# Patient Record
Sex: Male | Born: 1950 | Race: White | Hispanic: No | Marital: Married | Smoking: Never smoker
Health system: Southern US, Community
[De-identification: ages and names within clinical notes are randomized; demographics above are authoritative.]

## PROBLEM LIST (undated history)

## (undated) DIAGNOSIS — Z85118 Personal history of other malignant neoplasm of bronchus and lung: Secondary | ICD-10-CM

## (undated) DIAGNOSIS — K219 Gastro-esophageal reflux disease without esophagitis: Secondary | ICD-10-CM

## (undated) DIAGNOSIS — K5909 Other constipation: Secondary | ICD-10-CM

## (undated) DIAGNOSIS — R058 Other specified cough: Secondary | ICD-10-CM

## (undated) DIAGNOSIS — E039 Hypothyroidism, unspecified: Secondary | ICD-10-CM

## (undated) DIAGNOSIS — R0989 Other specified symptoms and signs involving the circulatory and respiratory systems: Secondary | ICD-10-CM

## (undated) DIAGNOSIS — Z973 Presence of spectacles and contact lenses: Secondary | ICD-10-CM

## (undated) DIAGNOSIS — M1990 Unspecified osteoarthritis, unspecified site: Secondary | ICD-10-CM

## (undated) DIAGNOSIS — R05 Cough: Secondary | ICD-10-CM

## (undated) DIAGNOSIS — N201 Calculus of ureter: Secondary | ICD-10-CM

## (undated) DIAGNOSIS — I10 Essential (primary) hypertension: Secondary | ICD-10-CM

## (undated) HISTORY — PX: VIDEO ASSISTED THORACOSCOPY (VATS)/WEDGE RESECTION: SHX6174

---

## 2002-08-15 ENCOUNTER — Encounter: Admission: RE | Admit: 2002-08-15 | Discharge: 2002-08-15 | Payer: Self-pay | Admitting: Internal Medicine

## 2002-08-15 ENCOUNTER — Encounter: Payer: Self-pay | Admitting: Internal Medicine

## 2003-05-05 ENCOUNTER — Ambulatory Visit (HOSPITAL_COMMUNITY): Admission: RE | Admit: 2003-05-05 | Discharge: 2003-05-05 | Payer: Self-pay | Admitting: Gastroenterology

## 2003-05-08 ENCOUNTER — Emergency Department (HOSPITAL_COMMUNITY): Admission: AD | Admit: 2003-05-08 | Discharge: 2003-05-08 | Payer: Self-pay | Admitting: Family Medicine

## 2003-06-12 ENCOUNTER — Emergency Department (HOSPITAL_COMMUNITY): Admission: AD | Admit: 2003-06-12 | Discharge: 2003-06-12 | Payer: Self-pay | Admitting: Family Medicine

## 2004-02-02 ENCOUNTER — Ambulatory Visit (HOSPITAL_COMMUNITY): Admission: RE | Admit: 2004-02-02 | Discharge: 2004-02-02 | Payer: Self-pay | Admitting: Thoracic Surgery

## 2004-11-08 ENCOUNTER — Emergency Department (HOSPITAL_COMMUNITY): Admission: EM | Admit: 2004-11-08 | Discharge: 2004-11-08 | Payer: Self-pay | Admitting: Family Medicine

## 2005-09-20 ENCOUNTER — Encounter (INDEPENDENT_AMBULATORY_CARE_PROVIDER_SITE_OTHER): Payer: Self-pay | Admitting: Specialist

## 2005-09-20 ENCOUNTER — Ambulatory Visit (HOSPITAL_COMMUNITY): Admission: RE | Admit: 2005-09-20 | Discharge: 2005-09-20 | Payer: Self-pay | Admitting: Thoracic Surgery

## 2005-09-26 ENCOUNTER — Encounter (INDEPENDENT_AMBULATORY_CARE_PROVIDER_SITE_OTHER): Payer: Self-pay | Admitting: *Deleted

## 2005-09-26 ENCOUNTER — Inpatient Hospital Stay (HOSPITAL_COMMUNITY): Admission: RE | Admit: 2005-09-26 | Discharge: 2005-10-01 | Payer: Self-pay | Admitting: Thoracic Surgery

## 2005-09-26 HISTORY — PX: THORACOTOMY/LOBECTOMY: SHX6116

## 2005-09-30 ENCOUNTER — Ambulatory Visit: Payer: Self-pay | Admitting: Oncology

## 2005-10-05 ENCOUNTER — Encounter: Admission: RE | Admit: 2005-10-05 | Discharge: 2005-10-05 | Payer: Self-pay | Admitting: Thoracic Surgery

## 2005-10-19 ENCOUNTER — Encounter: Admission: RE | Admit: 2005-10-19 | Discharge: 2005-10-19 | Payer: Self-pay | Admitting: Thoracic Surgery

## 2005-11-09 ENCOUNTER — Encounter: Admission: RE | Admit: 2005-11-09 | Discharge: 2005-11-09 | Payer: Self-pay | Admitting: Thoracic Surgery

## 2005-11-19 ENCOUNTER — Emergency Department (HOSPITAL_COMMUNITY): Admission: EM | Admit: 2005-11-19 | Discharge: 2005-11-19 | Payer: Self-pay | Admitting: Family Medicine

## 2005-12-08 ENCOUNTER — Ambulatory Visit: Payer: Self-pay | Admitting: Oncology

## 2005-12-13 LAB — CBC WITH DIFFERENTIAL/PLATELET
Basophils Absolute: 0 10*3/uL (ref 0.0–0.1)
EOS%: 2.9 % (ref 0.0–7.0)
Eosinophils Absolute: 0.2 10*3/uL (ref 0.0–0.5)
HCT: 40.3 % (ref 38.7–49.9)
HGB: 13.9 g/dL (ref 13.0–17.1)
LYMPH%: 25 % (ref 14.0–48.0)
MCH: 30.6 pg (ref 28.0–33.4)
MCV: 88.5 fL (ref 81.6–98.0)
MONO%: 8.8 % (ref 0.0–13.0)
NEUT#: 4.5 10*3/uL (ref 1.5–6.5)
NEUT%: 62.7 % (ref 40.0–75.0)
Platelets: 257 10*3/uL (ref 145–400)

## 2005-12-13 LAB — COMPREHENSIVE METABOLIC PANEL
AST: 21 U/L (ref 0–37)
Albumin: 4.5 g/dL (ref 3.5–5.2)
Alkaline Phosphatase: 60 U/L (ref 39–117)
BUN: 17 mg/dL (ref 6–23)
Creatinine, Ser: 1.21 mg/dL (ref 0.40–1.50)
Glucose, Bld: 124 mg/dL — ABNORMAL HIGH (ref 70–99)
Potassium: 4.2 mEq/L (ref 3.5–5.3)

## 2005-12-15 ENCOUNTER — Encounter: Admission: RE | Admit: 2005-12-15 | Discharge: 2005-12-15 | Payer: Self-pay | Admitting: Oncology

## 2005-12-21 ENCOUNTER — Encounter: Admission: RE | Admit: 2005-12-21 | Discharge: 2005-12-21 | Payer: Self-pay | Admitting: Thoracic Surgery

## 2006-04-06 ENCOUNTER — Ambulatory Visit: Payer: Self-pay | Admitting: Oncology

## 2006-04-12 ENCOUNTER — Ambulatory Visit: Payer: Self-pay | Admitting: Oncology

## 2006-04-12 LAB — COMPREHENSIVE METABOLIC PANEL
Albumin: 4.6 g/dL (ref 3.5–5.2)
BUN: 19 mg/dL (ref 6–23)
CO2: 28 mEq/L (ref 19–32)
Calcium: 9.4 mg/dL (ref 8.4–10.5)
Glucose, Bld: 110 mg/dL — ABNORMAL HIGH (ref 70–99)
Potassium: 4.4 mEq/L (ref 3.5–5.3)
Sodium: 141 mEq/L (ref 135–145)
Total Protein: 6.8 g/dL (ref 6.0–8.3)

## 2006-04-12 LAB — CBC WITH DIFFERENTIAL/PLATELET
Basophils Absolute: 0 10*3/uL (ref 0.0–0.1)
Eosinophils Absolute: 0.2 10*3/uL (ref 0.0–0.5)
HGB: 14 g/dL (ref 13.0–17.1)
MCV: 90.8 fL (ref 81.6–98.0)
MONO#: 0.5 10*3/uL (ref 0.1–0.9)
NEUT#: 5.6 10*3/uL (ref 1.5–6.5)
Platelets: 243 10*3/uL (ref 145–400)
RBC: 4.56 10*6/uL (ref 4.20–5.71)
RDW: 13.1 % (ref 11.2–14.6)
WBC: 7.8 10*3/uL (ref 4.0–10.0)

## 2006-04-12 LAB — LACTATE DEHYDROGENASE: LDH: 163 U/L (ref 94–250)

## 2006-04-13 ENCOUNTER — Encounter: Admission: RE | Admit: 2006-04-13 | Discharge: 2006-04-13 | Payer: Self-pay | Admitting: Oncology

## 2006-05-03 ENCOUNTER — Encounter: Admission: RE | Admit: 2006-05-03 | Discharge: 2006-05-03 | Payer: Self-pay | Admitting: Thoracic Surgery

## 2006-08-11 ENCOUNTER — Ambulatory Visit: Payer: Self-pay | Admitting: Oncology

## 2006-08-15 LAB — CBC WITH DIFFERENTIAL/PLATELET
BASO%: 0.5 % (ref 0.0–2.0)
Eosinophils Absolute: 0.3 10*3/uL (ref 0.0–0.5)
LYMPH%: 38.1 % (ref 14.0–48.0)
MCHC: 35.5 g/dL (ref 32.0–35.9)
MONO#: 0.7 10*3/uL (ref 0.1–0.9)
NEUT#: 2.8 10*3/uL (ref 1.5–6.5)
RBC: 4.41 10*6/uL (ref 4.20–5.71)
RDW: 13.1 % (ref 11.2–14.6)
WBC: 6.2 10*3/uL (ref 4.0–10.0)
lymph#: 2.4 10*3/uL (ref 0.9–3.3)

## 2006-08-15 LAB — COMPREHENSIVE METABOLIC PANEL
ALT: 28 U/L (ref 0–53)
Albumin: 3.9 g/dL (ref 3.5–5.2)
CO2: 25 mEq/L (ref 19–32)
Chloride: 105 mEq/L (ref 96–112)
Potassium: 4.3 mEq/L (ref 3.5–5.3)
Sodium: 140 mEq/L (ref 135–145)
Total Bilirubin: 1.3 mg/dL — ABNORMAL HIGH (ref 0.3–1.2)
Total Protein: 6.5 g/dL (ref 6.0–8.3)

## 2006-08-15 LAB — LACTATE DEHYDROGENASE: LDH: 187 U/L (ref 94–250)

## 2006-08-18 ENCOUNTER — Ambulatory Visit (HOSPITAL_COMMUNITY): Admission: RE | Admit: 2006-08-18 | Discharge: 2006-08-18 | Payer: Self-pay | Admitting: Oncology

## 2006-09-06 ENCOUNTER — Encounter: Admission: RE | Admit: 2006-09-06 | Discharge: 2006-09-06 | Payer: Self-pay | Admitting: Thoracic Surgery

## 2006-09-06 ENCOUNTER — Ambulatory Visit: Payer: Self-pay | Admitting: Thoracic Surgery

## 2006-12-07 ENCOUNTER — Ambulatory Visit: Payer: Self-pay | Admitting: Oncology

## 2006-12-12 LAB — COMPREHENSIVE METABOLIC PANEL
ALT: 24 U/L (ref 0–53)
AST: 20 U/L (ref 0–37)
Creatinine, Ser: 1.51 mg/dL — ABNORMAL HIGH (ref 0.40–1.50)
Total Bilirubin: 0.7 mg/dL (ref 0.3–1.2)

## 2006-12-12 LAB — CBC WITH DIFFERENTIAL/PLATELET
BASO%: 0.3 % (ref 0.0–2.0)
EOS%: 0.5 % (ref 0.0–7.0)
HCT: 39.2 % (ref 38.7–49.9)
LYMPH%: 13.1 % — ABNORMAL LOW (ref 14.0–48.0)
MCH: 31.4 pg (ref 28.0–33.4)
MCHC: 35.8 g/dL (ref 32.0–35.9)
NEUT%: 78.7 % — ABNORMAL HIGH (ref 40.0–75.0)
RBC: 4.47 10*6/uL (ref 4.20–5.71)
lymph#: 1.4 10*3/uL (ref 0.9–3.3)

## 2006-12-15 ENCOUNTER — Ambulatory Visit (HOSPITAL_COMMUNITY): Admission: RE | Admit: 2006-12-15 | Discharge: 2006-12-15 | Payer: Self-pay | Admitting: Oncology

## 2007-01-03 ENCOUNTER — Ambulatory Visit: Payer: Self-pay | Admitting: Thoracic Surgery

## 2007-01-27 ENCOUNTER — Emergency Department (HOSPITAL_COMMUNITY): Admission: EM | Admit: 2007-01-27 | Discharge: 2007-01-27 | Payer: Self-pay | Admitting: Emergency Medicine

## 2007-03-13 IMAGING — CR DG CHEST 1V PORT
1 series · 1 of 1 positions shown · non-contrast
Comparison: 09/26/05.

CLINICAL DATA: Thoracotomy.  
 PORTABLE CHEST ? 1 VIEW:

[view not recorded]
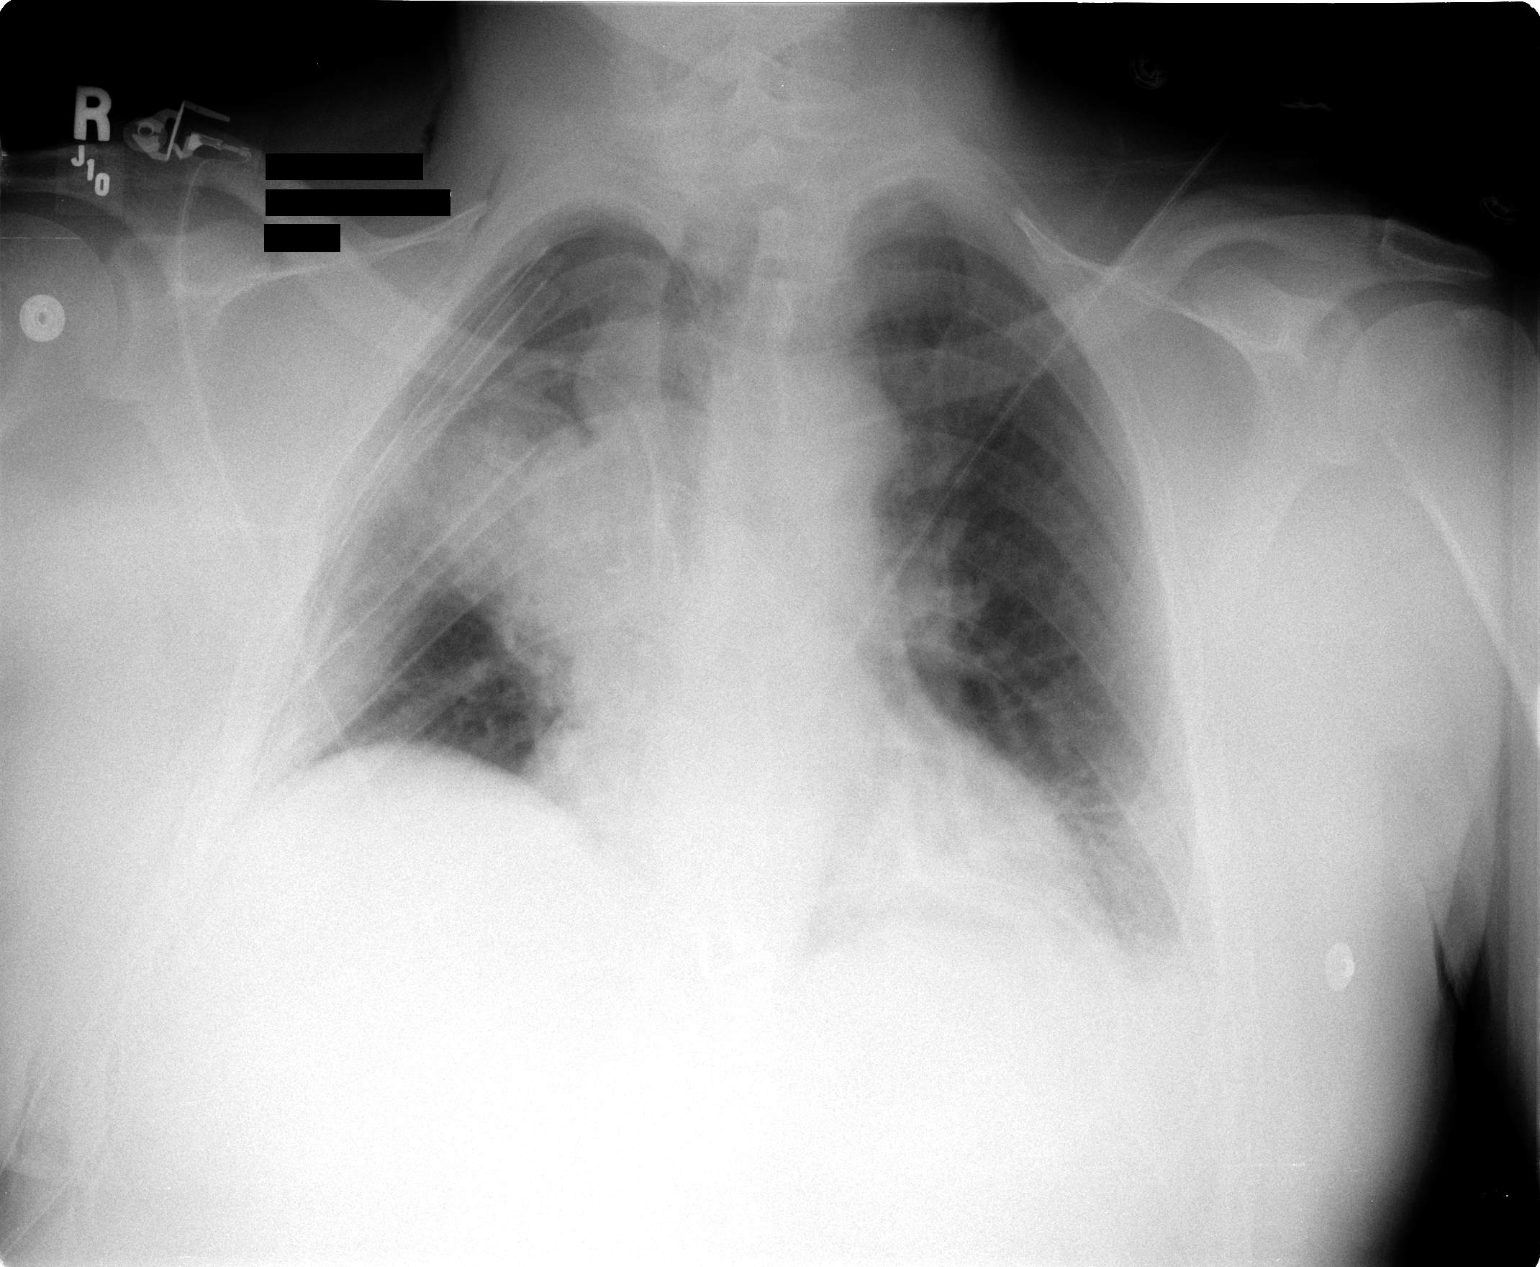

[1 of 1 positions shown; findings below may reference images not displayed]

FINDINGS: There are two right-sided chest tubes.  Postop changes in the right hemithorax are again noted.  There is persists atelectasis left base.
IMPRESSION: 1.  Two right-sided chest tubes without pneumothorax. 
 2.  Postop changes right hemithorax. 
 3.  Left base atelectasis.

## 2007-03-15 IMAGING — CR DG CHEST 1V PORT
1 series · 1 of 1 positions shown · non-contrast
Comparison: none

CLINICAL DATA: Right chest tube has been removed.  
 PORTABLE CHEST ? 1 VIEW ? 09/29/05 AT 6066 HOURS:

[view not recorded]
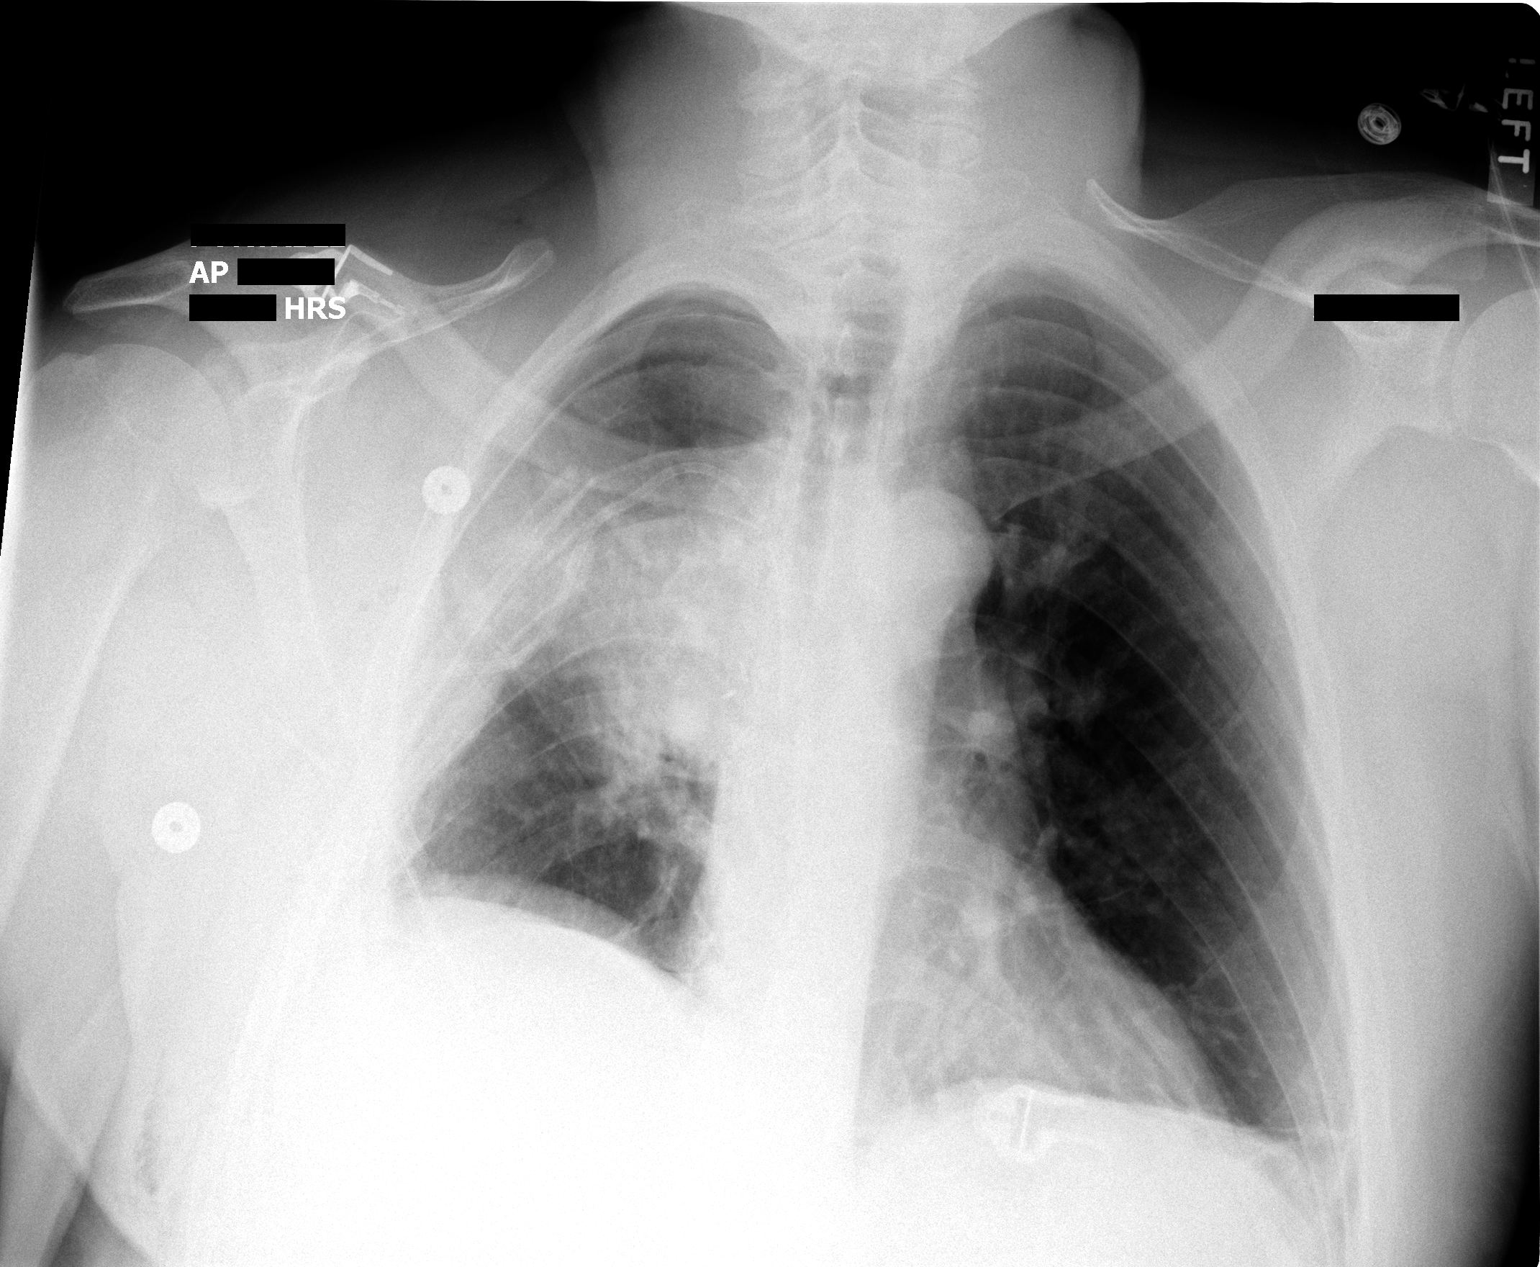

[1 of 1 positions shown; findings below may reference images not displayed]

FINDINGS: An AP erect portable film of the chest made 09/29/05 at 6066 hours is compared to the previous study of 09/29/05 at 4594 hours and now shows the right chest tube to have been removed.  There remains a right Port-A-Cath in place.  The right apical pneumothorax has significantly decreased and now is estimated at less than 3%.  There is again noted the density in the region of the right hilus in the right midlung.  There is also some atelectasis at both costophrenic angles.
IMPRESSION: Right chest tube has been removed.  There has been interval decrease in the amount of right apical pneumothorax which is now estimated at less than 3%.

## 2007-03-16 IMAGING — CR DG CHEST 2V
2 series · 2 of 2 positions shown · non-contrast
Comparison: 09/29/05.

CLINICAL DATA: Post-VATS for lung lesion.
 CHEST ? 2 VIEW:

[w chest pa]
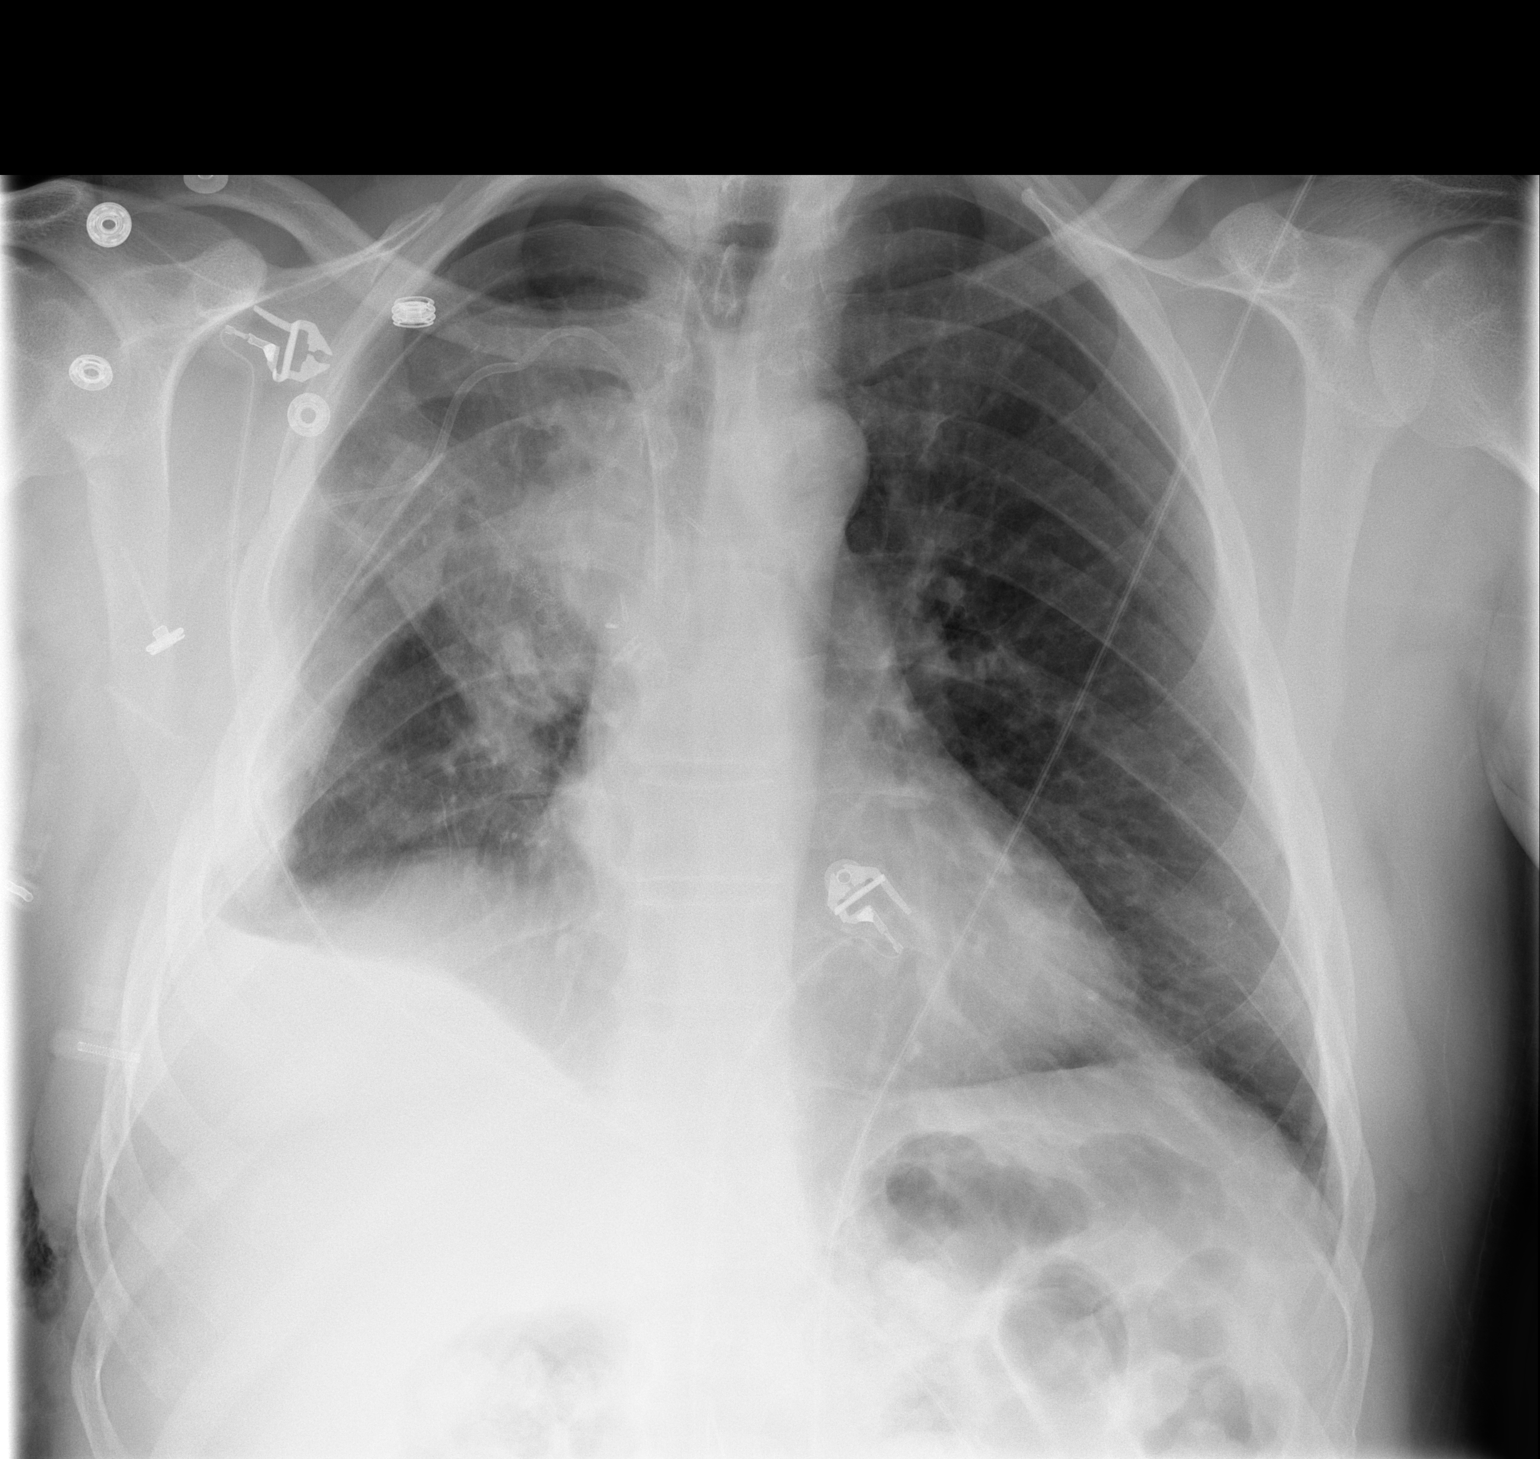

[w chest lat]
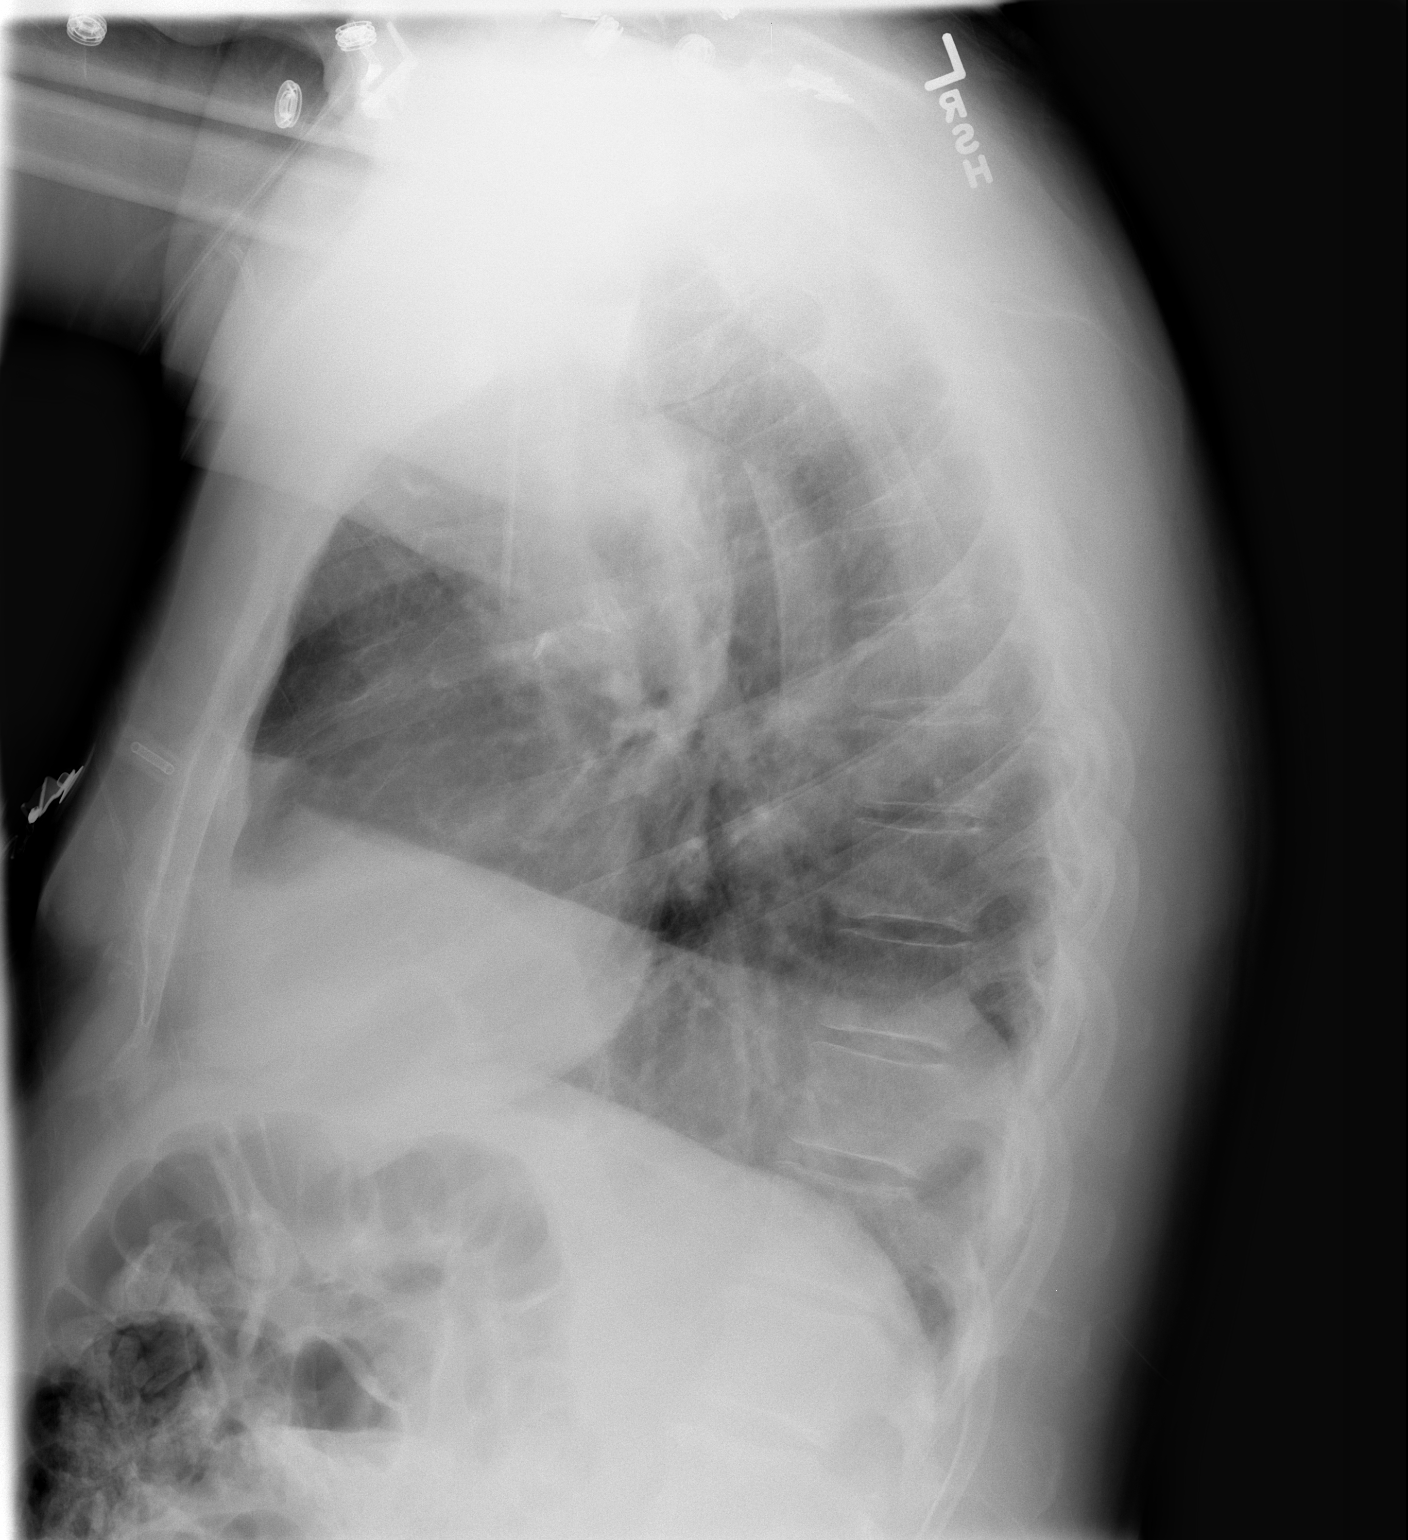

[2 of 2 positions shown; findings below may reference images not displayed]

FINDINGS: Right apical pneumothorax of about 5% to 10%.  Slightly larger.  
 No change in the right lung or pleural densities.  Left lung remains clear.  
 Port-A-Cath is ?pinched? near the costoclavicular ligament.  This needs to be watched closely as it can be a harbinger for catheter fracture.
IMPRESSION: 1.  Postoperative changes on the right.  The right apical pneumothorax is slightly larger, in the 5 to 10% size range.    
 2.  The right Port-A-Cath is ?pinched? under the clavicle.  See report.

## 2007-04-19 ENCOUNTER — Ambulatory Visit: Payer: Self-pay | Admitting: Surgery

## 2007-04-27 ENCOUNTER — Ambulatory Visit: Payer: Self-pay | Admitting: Oncology

## 2008-05-30 IMAGING — CT CT CHEST W/ CM
2 of 3 series · 15 of 36 positions shown, 18 images · non-contrast
Comparison: none

CLINICAL DATA: Lung carcinoma status post resection.

[Series 2: chest routine 5.0 b40f · axial · 0.66mm/px · z∈[+508,+793]mm · 12 of 67 slices shown, 15 images]
[im 5/67  mediastinal]
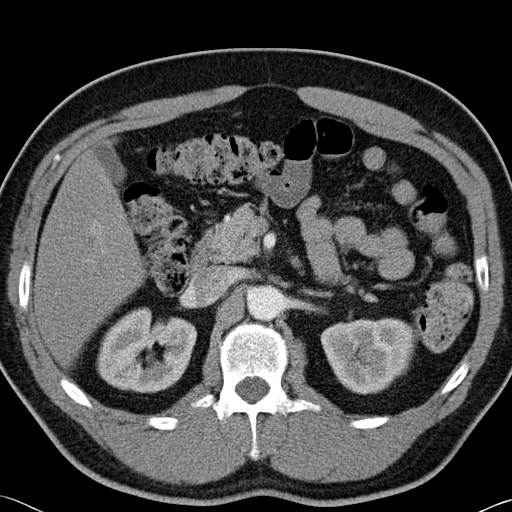
[im 5/67  lung]
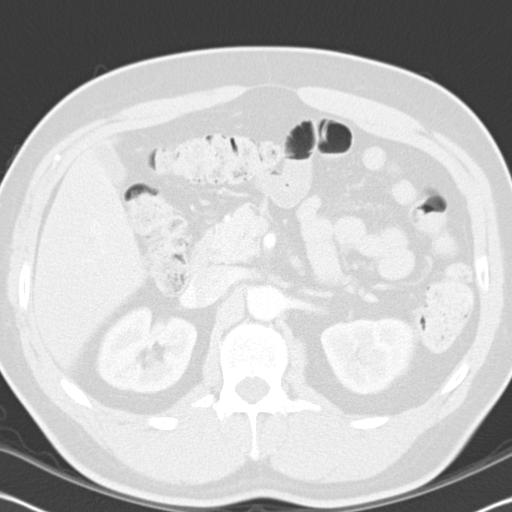
[im 10/67  lung]
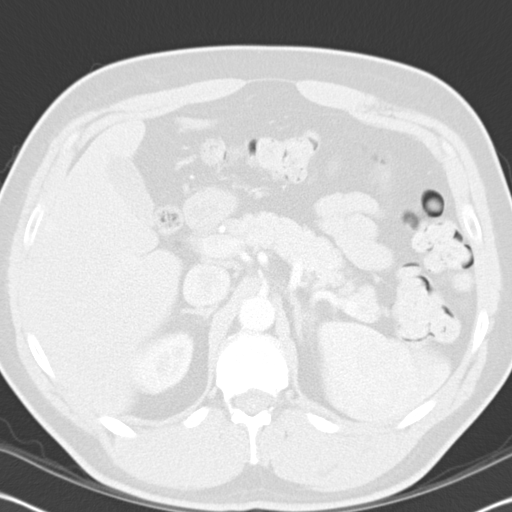
[im 15/67  lung]
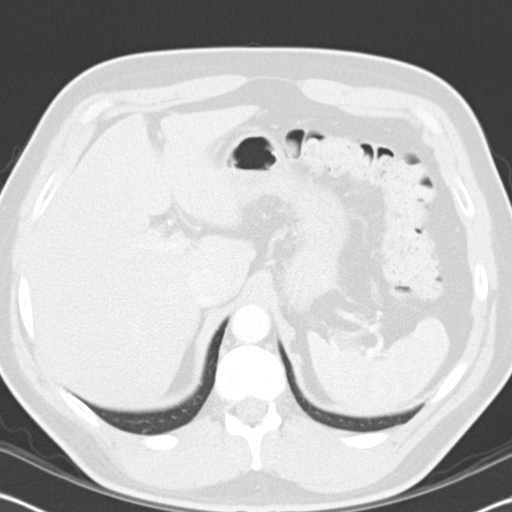
[im 20/67  lung]
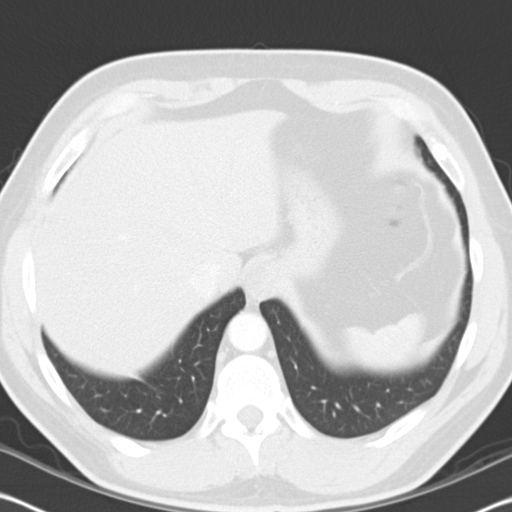
[im 25/67  mediastinal]
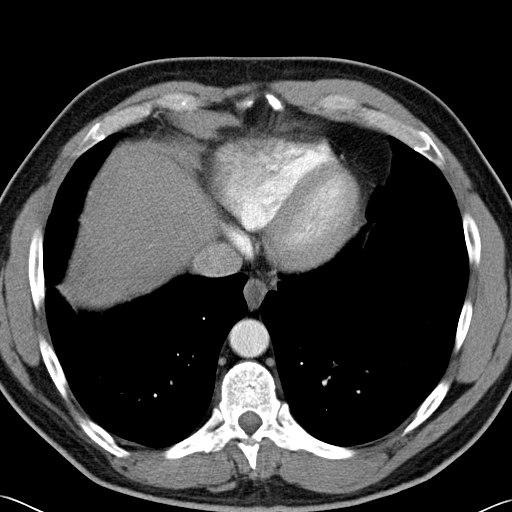
[im 25/67  lung]
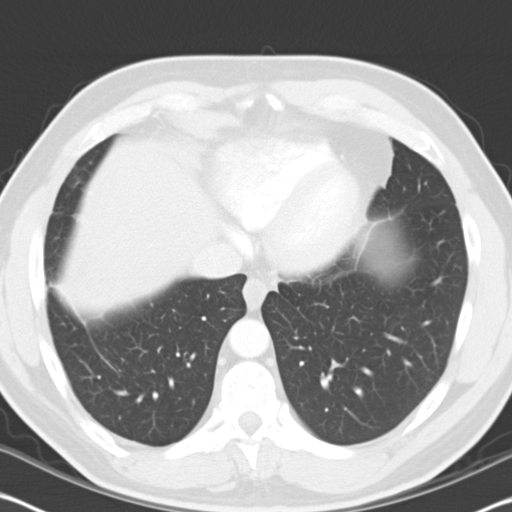
[im 30/67  lung]
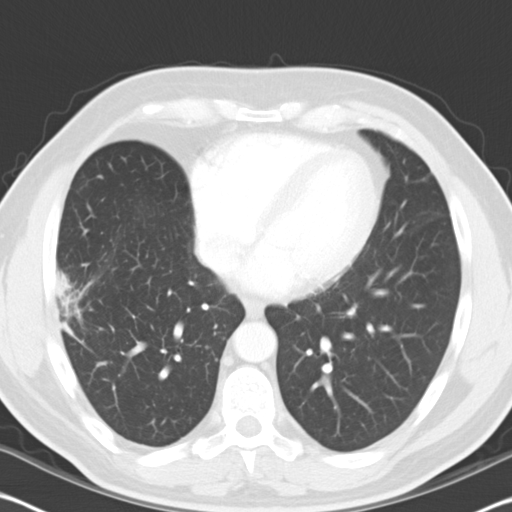
[im 37/67  lung]
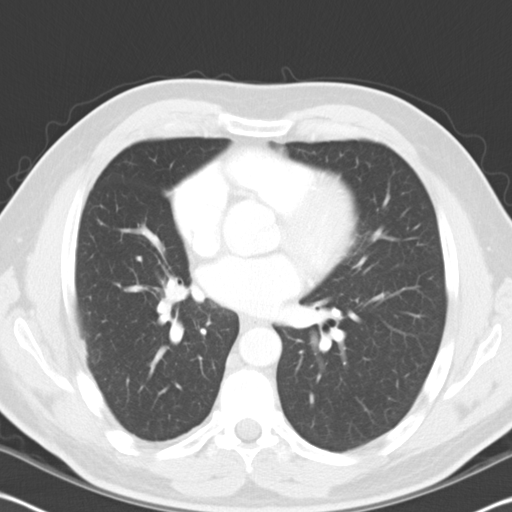
[im 42/67  lung]
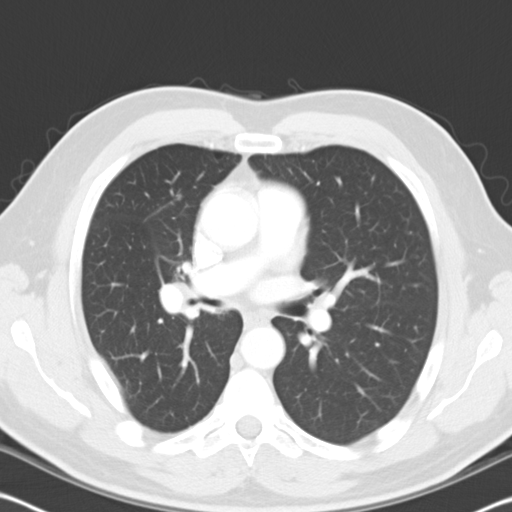
[im 47/67  mediastinal]
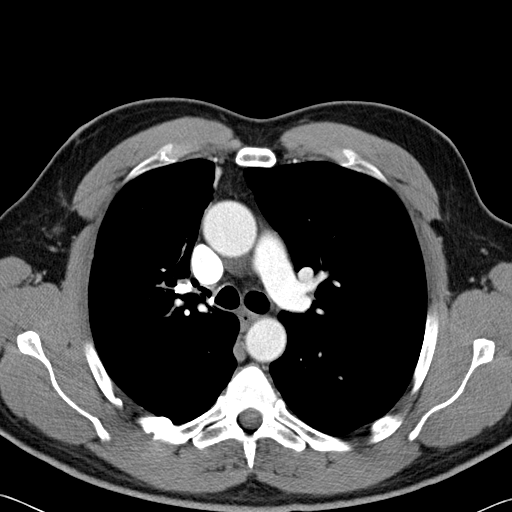
[im 47/67  lung]
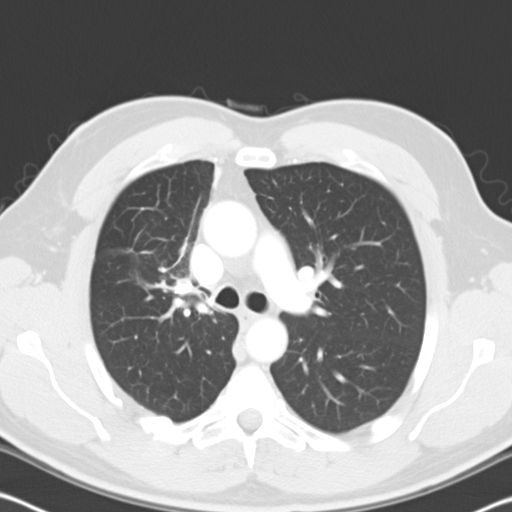
[im 52/67  lung]
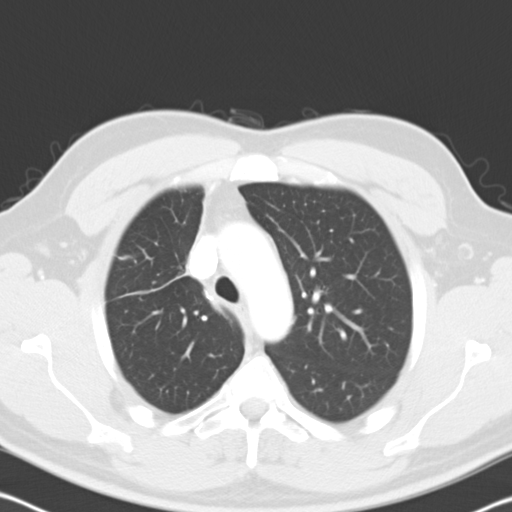
[im 57/67  lung]
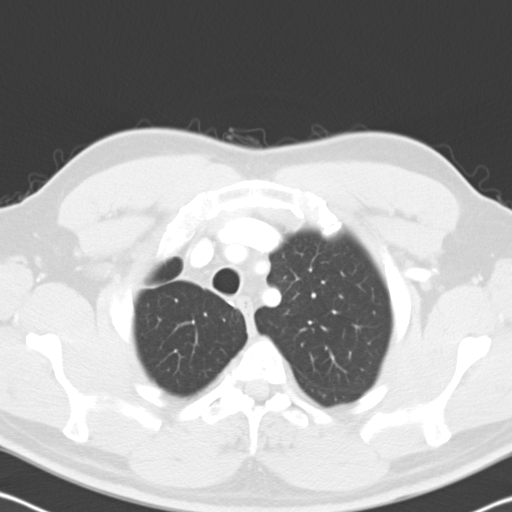
[im 62/67  lung]
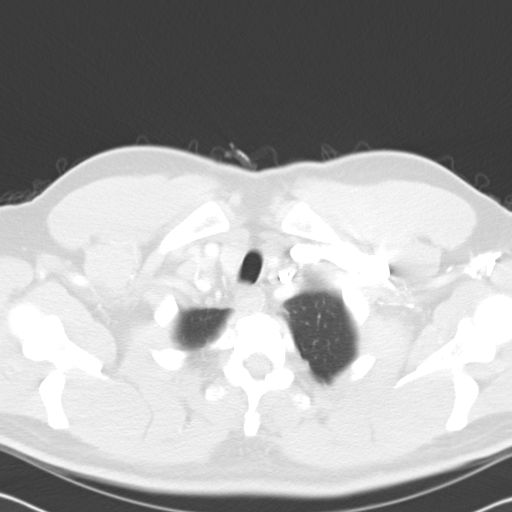

[Series 602: <mpr range> · coronal · 0.66mm/px · 3 of 120 slices shown]
[im 24/120  lung]
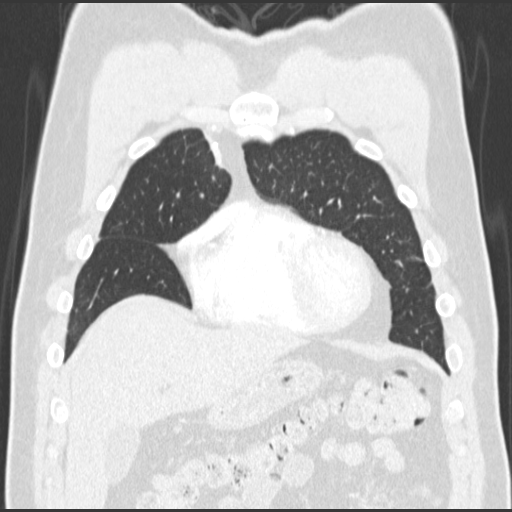
[im 48/120  lung]
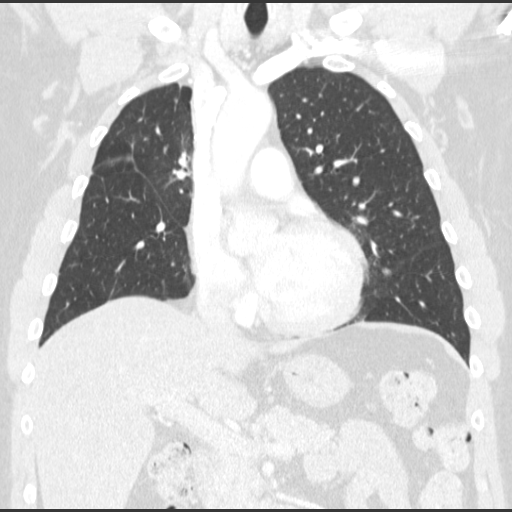
[im 72/120  lung]
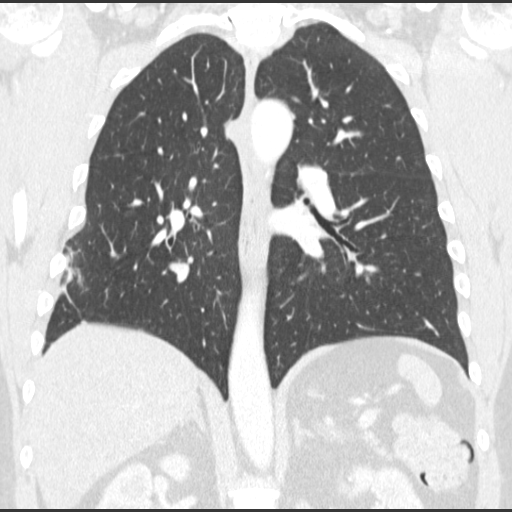

[15 of 36 positions shown; findings below may reference images not displayed]

CT chest with contrast:

Multidetector helical CT after  80 ml 8mnipaque-IPP IV.
Comparison 08/18/2006. Changes of previous right thoracotomy. Post partial right
pneumonectomy. Stable right hilar scarring without evident mass or adenopathy.
There is some pleural based nodular opacities laterally at the right lung base
which which are slightly more prominent than on the previous study. This region
measures approximately 6 x 23 mm in maximum axial dimensions. No new mediastinal
adenopathy. No pleural or pericardial effusion. Visualized upper abdomen
including adrenal glands unremarkable.
IMPRESSION: 1. Slight increase in pleural based masslike consolidation laterally at the
right lung base suggesting residual or recurrent tumor versus progressive
scarring. PET-CT may be useful to evaluate for degree of metabolic activity.
This may be approachable for FNA biopsy if needed.

## 2010-05-02 ENCOUNTER — Encounter: Payer: Self-pay | Admitting: Thoracic Surgery

## 2010-05-02 ENCOUNTER — Encounter: Payer: Self-pay | Admitting: Oncology

## 2010-05-03 ENCOUNTER — Encounter: Payer: Self-pay | Admitting: Thoracic Surgery

## 2010-07-15 ENCOUNTER — Emergency Department (HOSPITAL_COMMUNITY): Payer: BC Managed Care – PPO

## 2010-07-15 ENCOUNTER — Inpatient Hospital Stay (INDEPENDENT_AMBULATORY_CARE_PROVIDER_SITE_OTHER)
Admission: RE | Admit: 2010-07-15 | Discharge: 2010-07-15 | Disposition: A | Discharge status: Home / Self Care | Payer: BC Managed Care – PPO | Source: Ambulatory Visit | Attending: Emergency Medicine | Admitting: Emergency Medicine

## 2010-07-15 ENCOUNTER — Emergency Department (HOSPITAL_COMMUNITY)
Admission: EM | Admit: 2010-07-15 | Discharge: 2010-07-15 | Disposition: A | Discharge status: Home / Self Care | Payer: BC Managed Care – PPO | Attending: Emergency Medicine | Admitting: Emergency Medicine

## 2010-07-15 DIAGNOSIS — I10 Essential (primary) hypertension: Secondary | ICD-10-CM | POA: Insufficient documentation

## 2010-07-15 DIAGNOSIS — N200 Calculus of kidney: Secondary | ICD-10-CM | POA: Insufficient documentation

## 2010-07-15 DIAGNOSIS — Z79899 Other long term (current) drug therapy: Secondary | ICD-10-CM | POA: Insufficient documentation

## 2010-07-15 DIAGNOSIS — N1330 Unspecified hydronephrosis: Secondary | ICD-10-CM | POA: Insufficient documentation

## 2010-07-15 DIAGNOSIS — Z86718 Personal history of other venous thrombosis and embolism: Secondary | ICD-10-CM | POA: Insufficient documentation

## 2010-07-15 DIAGNOSIS — R109 Unspecified abdominal pain: Secondary | ICD-10-CM

## 2010-07-15 DIAGNOSIS — E785 Hyperlipidemia, unspecified: Secondary | ICD-10-CM | POA: Insufficient documentation

## 2010-07-15 DIAGNOSIS — Z85118 Personal history of other malignant neoplasm of bronchus and lung: Secondary | ICD-10-CM | POA: Insufficient documentation

## 2010-07-15 DIAGNOSIS — E039 Hypothyroidism, unspecified: Secondary | ICD-10-CM | POA: Insufficient documentation

## 2010-07-15 DIAGNOSIS — R52 Pain, unspecified: Secondary | ICD-10-CM | POA: Insufficient documentation

## 2010-07-15 DIAGNOSIS — R3911 Hesitancy of micturition: Secondary | ICD-10-CM | POA: Insufficient documentation

## 2010-07-15 LAB — CBC
HCT: 40.6 % (ref 39.0–52.0)
Hemoglobin: 13.6 g/dL (ref 13.0–17.0)
MCV: 82.9 fL (ref 78.0–100.0)
RBC: 4.9 MIL/uL (ref 4.22–5.81)
RDW: 16 % — ABNORMAL HIGH (ref 11.5–15.5)
WBC: 7.6 10*3/uL (ref 4.0–10.5)

## 2010-07-15 LAB — POCT I-STAT, CHEM 8
Chloride: 105 mEq/L (ref 96–112)
Glucose, Bld: 149 mg/dL — ABNORMAL HIGH (ref 70–99)
HCT: 43 % (ref 39.0–52.0)
Potassium: 4 mEq/L (ref 3.5–5.1)
Sodium: 142 mEq/L (ref 135–145)

## 2010-07-15 LAB — POCT URINALYSIS DIP (DEVICE)
Glucose, UA: NEGATIVE mg/dL
Nitrite: NEGATIVE
Specific Gravity, Urine: >=1.03 (ref 1.005–1.030)

## 2010-07-15 LAB — DIFFERENTIAL
Basophils Absolute: 0 10*3/uL (ref 0.0–0.1)
Eosinophils Relative: 1 % (ref 0–5)
Lymphocytes Relative: 11 % — ABNORMAL LOW (ref 12–46)
Lymphs Abs: 0.9 10*3/uL (ref 0.7–4.0)
Neutro Abs: 6.1 10*3/uL (ref 1.7–7.7)

## 2010-08-24 NOTE — Assessment & Plan Note (Signed)
OFFICE VISIT   MIKLE, STERNBERG  DOB:  [birth year]                                        Sep 06, 2006  CHART #:  [id number]   Mr. Schriefer came for followup today.  His blood pressure is 141/83,  pulse 90, respirations 18, sats are 97%.  Incisions were well healed.  Chest x-ray was stable.  It showed postoperative change with scarring on  the right.  Recent CT scan showed no evidence of recurrence of his  cancer.  He will have another one in September and I will see him back  at that time.   Ines Bloomer, M.D.  Electronically Signed   DPB/MEDQ  D:  09/06/2006  T:  09/06/2006  Job:  [id number]

## 2010-08-24 NOTE — Procedures (Signed)
DUPLEX DEEP VENOUS EXAM - LOWER EXTREMITY   INDICATION:  Localized right calf pain and swelling, status post long  resection in December, 2008.   HISTORY:  Edema:  Yes.  Trauma/Surgery:  Pain:  Yes.  PE:  No.  Previous DVT:  No.  Anticoagulants:  Patient takes one 325 mg aspirin nightly.  Other:   DUPLEX EXAM:                CFV   SFV   PopV  PTV    GSV                R  L  R  L  R  L  R   L  R  L  Thrombosis    o  o  o     o     o      o  Spontaneous   +  +  +     +     +      +  Phasic        +  +  +     +     +      +  Augmentation  +  +  +     +     +      +  Compressible  +  +  +     +     +      +  Competent     +  +  +     +     +      +   Legend:  + - yes  o - no  p - partial  D - decreased   Superficial venous thrombosis in the posterior tributary branch of the  greater saphenous vein at the level of the mid calf to proximal calf.   IMPRESSION:  1. No evidence of right lower extremity deep venous thrombosis.  2. Evidence of superficial venous thrombosis in the posterior      tributary branch of the greater saphenous vein at the level of the      mid calf to proximal calf.    _____________________________  V. Charlena Cross, MD   PB/MEDQ  D:  04/19/2007  T:  04/19/2007  Job:  [id number]

## 2010-08-24 NOTE — Letter (Signed)
January 03, 2007   Genene Churn. Cyndie Chime, M.D.  [address] [facility] [facility]  [place]  [place] [address]   Re:  ELJAY, LAVE                DOB:  1950-12-13   Dear Rosanne Ashing:   I saw Mr. Knight in the office today and reviewed his CT scans and I  agree with you that the CT scan looks like it is probably some scarring.  I doubt and it does not look like there is a lot of an increase where we  did his wedge resection but then you always could worry that this could  be a recurrent cancer.  I do think that taking the conservative approach  and repeating the CT scan in 4 months is the way to go since this was  such a slow growing cancer to begin with.  My thought though is this is  probably just evidence of scarring.  I discussed this with Mr. Montminy  and he is agreeable with this plan.  I will see him back in January.  His blood pressure was 145/92, pulse 69, respirations 18, saturations  were 95%.   Ines Bloomer, M.D.  Electronically Signed   DPB/MEDQ  D:  01/03/2007  T:  01/04/2007  Job:  [id number]

## 2010-08-27 NOTE — Consult Note (Signed)
NAME:  James Christian, James Christian NO.:  [id number]   MEDICAL RECORD NO.:  [id number]          PATIENT TYPE:  INP   LOCATION:  2004                         FACILITY:  [facility]   PHYSICIAN:  Genene Churn. 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   James Christian has an interesting history.  James Christian is a nonsmoker.  James Christian was found to  have an abnormal area in the right upper lobe on chest radiograph two years  ago.  The mass had irregular characteristics on CT scan.  It showed minimal  uptake on a PET scan with SUV of 2.  James Christian was followed for the first year with  CT scans at four month intervals and then subsequent six month intervals.  The lesion showed subtle changes on a most recent August 08, 2005, study done  at [facility].  There was interval enlargement and increased density of  what is described as an elongated spiculated irregularly marginated right  upper lobe lesion, now measuring 3 x 2.5 cm in transverse diameter compared  with 2 x 2.5 cm. Lenghtwise dimensions up to 8 cm compared with 6 cm. Scans  currently not available for my review at time of this dictation.   A biopsy was performed on September 20, 2005, and was consistent with  adenocarcinoma.  The patient was admitted on September 26, 2005, and underwent a  right upper lobe lobectomy.  The pathology shows a 3 cm bronchioalveolar  carcinoma.  There is focal involvement of the visceral pleura.  Lymph nodes  taken from level 12, level I, level II, level III, and an intraparenchymal  lymph node all negative.  A tertiary margin was positive for  bronchioalveolar carcinoma but per my discussion with Dr. Edwyna Shell, all final  margins were negative.   Past medical history includes  hypothyroidism, hyperlipidemia, hypertension,  seasonal allergies.  An appendectomy at age [age].  No other surgeries.  No  radiation exposure.  No radioactive iodine for James Christian thyroid disease.   MEDS ON ADMISSION:  Included Avecor 500/20, 2 at h.s., Synthroid 0.75 mg  daily, Benicar 20 mg h.s., Allegra one h.s., Nasonex b.i.d., aspirin one  h.s., Xanax 0.25 mg p.r.n., Pepcid 20 mg p.r.n., Amicar 2 daily (?).   SOCIAL HISTORY:  James Christian is married.  James Christian and James Christian accompanies him tonight.  James Christian is a nonsmoker.  Rare alcohol.   REVIEW OF SYSTEMS:  No cardiorespiratory symptoms prior to the lung cancer  diagnosis.  No constitutional symptoms.   PHYSICAL EXAMINATION:  VITAL SIGNS:  Blood pressure 150/96, pulse 66 and regular, respirations 16,  temperature 97.5.  SKIN HAIR AND NAILS:  Normal.  HEENT:  Pupils equal and reactive to light.  Pharynx no erythema, exudate or  mass.  NECK:  No thyromegaly.  No lymphadenopathy in the neck, supraclavicular or  axillary regions.  CHEST:  Healing scar right hemithorax, left lung is clear, upper right lung  clear.  HEART:  Regular cardiac rhythm.  No murmur or gallop.  ABDOMEN:  Soft and nontender.  EXTREMITIES:  No edema.  No calf tenderness.   PERTINENT LAB:  On June 15, preop, hemoglobin 15, hematocrit 45, MCV 91,  white count 6700, platelet count 239,000.  Chem profile normal.  Random  glucose 118. BUN 14, creatinine 1.3, calcium 9.6, albumin 4.4, SGOT 27, SGPT  31, alkaline phosphatase 54, bilirubin 1.1.  Urinalysis normal.  Blood type  O+.  Electrocardiogram normal.   IMPRESSION:  60 year old nonsmoker being followed since October2005 for an  atypical right upper lobe lung lesion now found to be bronchioalveolar  carcinoma.  James Christian has a stage IB by virtue of focal visceral pleural  involvement.  Overall tumor size is small at 3 cm.  All lymph nodes are  negative.  No evidence for metastatic spread (T3N0M0).   RECOMMENDATION.:  Unfortunately at the  present time as data has become more  mature from a number of large recent clinical trials, there does not seem to  be a significant benefit to adjuvant chemotherapy in stage IA or IB non-  small cell lung cancers.   RECOMMENDATIONS:  Observation alone with clinical and radiographic follow-up  at intervals.  If James Christian does show signs of progression I think James Christian would be a  good candidate for oral Tarceva, epidermal growth factor receptor inhibitor,  which is showing higher responses in previous nonsmokers with  bronchioalveolar histology.  I will arrange for outpatient follow-up and  continue to follow the patient with you.  Thank you for this consultation.      Genene Churn. Cyndie Chime, M.D.  Electronically Signed     JMG/MEDQ  D:  09/30/2005  T:  09/30/2005  Job:  [id number]   cc:   Ines Bloomer, M.D.  [address]  [place]  [place] [address]   Larina Earthly, M.D.  Fax: [phone]

## 2010-08-27 NOTE — Op Note (Signed)
NAME:  TYAN, DY NO.:  [id number]   MEDICAL RECORD NO.:  [id number]                   PATIENT TYPE:  AMB   LOCATION:  ENDO                                 FACILITY:  [facility]   PHYSICIAN:  Petra Kuba, M.D.                 DATE OF BIRTH:  December 06, 1950   DATE OF PROCEDURE:  05/05/2003  DATE OF DISCHARGE:                                 OPERATIVE REPORT   PROCEDURE:  Colonoscopy.   INDICATIONS FOR PROCEDURE:  Bright red blood per rectum, due for colonic  screening. Consent was signed  after the risks and benefits, methods and  options were thoroughly discussed in the office.   MEDICATIONS USED:  Demerol 90, Versed 9.   DESCRIPTION OF PROCEDURE:  A rectal inspection was pertinent for external  hemorrhoids. A small  digital examination  was negative. First the video  regular colonoscope was inserted. However, due to a tortuous  sigmoid, we  seemed to be causing pain, and we elected to withdraw. The pediatric video  adjustable colonoscope was inserted, and with abdominal  pressure and  rolling  him on his back, we were easier able to advance through the  sigmoid. Once through  that area, he was rolled back on the left side and  was able to be advanced  to the cecum.   No obvious abnormalities were seen. On insertion the cecum was identified by  the appendiceal orifice and the ileocecal valve. The scope was slowly  withdrawn. The prep was adequate. There was some liquid stool  that required  washing and suctioning. On slow withdrawal through the colon, no  abnormalities were seen, specifically no polyps, diverticula, signs of  bleeding, etc. Anorectal pullthrough and retroflexion did confirm some  hemorrhoids. He did have a tortuous sigmoid on withdrawal as well.   The scope was reinserted a short way up the left side of the colon. Air was  suctioned. The scope was removed. The patient tolerated the procedure fairly  adequately. There  were no obvious  immediate complications.   ENDOSCOPIC DIAGNOSES:  1. Internal and external hemorrhoids.  2. Tortuous sigmoid.  3. Otherwise  within normal limits to the cecum.   PLAN:  Go ahead and treat hemorrhoids aggressively through surgery p.r.n.  Have him see me back p.r.n., otherwise  yearly rectals and Guaiacs per Dr.  Felipa Eth and repeat colon screening in 5 to 10 years. May consider Diprivan if  widely available at that time.                                               Petra Kuba, M.D.    MEM/MEDQ  D:  05/05/2003  T:  05/05/2003  Job:  [id number]   cc:   Daleen Bo  Felipa Eth, M.D.  [address]  [place]  [place] [address]  Fax: [phone]

## 2010-08-27 NOTE — H&P (Signed)
NAME:  James Christian, James Christian NO.:  [id number]   MEDICAL RECORD NO.:  [id number]          PATIENT TYPE:  INP   LOCATION:  2550                         FACILITY:  [facility]   PHYSICIAN:  Ines Bloomer, M.D. DATE OF BIRTH:  [birth year]   DATE OF ADMISSION:  09/26/2005  DATE OF DISCHARGE:                                HISTORY & PHYSICAL   CHIEF COMPLAINT:  Lung mass.   HISTORY OF PRESENT ILLNESS:  This is a 60 year old nonsmoker who 2 years ago  was found to have a right upper lobe infiltrative lesion.  It was very  irregular.  We decided to do a PET scan on this and the PET scan was  negative with an SUV of 2 and we followed the protocol with CT scans at 4  months, 6 months, and he came back with the CT scan approximately 2 years  after this original CT and there was some slight change.  Because of that we  did a needle biopsy which showed well differentiated adenocarcinoma.  A  right upper lobectomy and node dissection was recommended.   MEDICATIONS:  1.  Avecor 500/20 mg two at night.  2.  Synthroid 0.75 every morning.  3.  Benicar 20 mg at night.  4.  Allegra one at night.  5.  Nasonex twice a day.  6.  Aspirin at night.  7.  Xanax 0.25 p.r.n.  8.  Pepcid 20 mg p.r.n.  9.  Amicar 2 daily.   PAST MEDICAL HISTORY:  He has had hyperthyroidism.  He previously had  colonoscopy done in 2005 which he had some external tortuous sigmoid,  external hemorrhoids.   FAMILY HISTORY:  Noncontributory.   SOCIAL HISTORY:  He is married and nonsmoker.  Occasional alcohol intake.   REVIEW OF SYSTEMS:  His weight has been stable.  CARDIOVASCULAR:  No angina  or atrial fibrillation.  PULMONARY:  See history of present illness.  GASTROINTESTINAL:  Occasional dyspepsia.  GENITOURINARY:  No dysuria or  frequent urinations.  MUSCULOSKELETAL:  NEUROLOGY:  No headaches, blackouts,  or seizures.  HEMATOLOGIC:  No anemia or clotting disorders.  No psychiatric  illnesses.  ENT:  No  changes in eye sight or hearing.   PHYSICAL EXAMINATION:  GENERAL:  He is a well-developed, Caucasian male, in  no acute distress.  VITAL SIGNS:  Blood pressure 140/80, respirations 20, saturations were 96%,  and pulse was 80.  HEENT:  Head is atraumatic.  Eyes; pupils equal, round, and reactive to  light and accommodation.  Extraocular movements are normal.  Ears; tympanic  membranes are intact.  Nose; there is no septal deviation.  Throat is  without lesion.  NECK:  Supple with no thyromegaly, no supraclavicular or axillary  adenopathy.  CHEST:  Clear to auscultation and percussion.  HEART:  Regular rate and rhythm with no murmurs.  ABDOMEN:  Soft.  There is no hepatosplenomegaly.  Bowel sounds are normal.  EXTREMITIES:  Pulses are 2+.  There is no clubbing or edema.  NEUROLOGY:  Oriented x3.  Sensory and motor are intact.  Cranial nerves II-  XII grossly intact.   IMPRESSION:  1.  Right upper lobe mass.  2.  Hyperthyroidism.  3.  Hypertension.  4.  Seasonal allergies.   PLAN:  Right upper lobectomy.           ______________________________  Ines Bloomer, M.D.     DPB/MEDQ  D:  09/26/2005  T:  09/26/2005  Job:  [id number]

## 2010-08-27 NOTE — Discharge Summary (Signed)
NAME:  James Christian, James Christian NO.:  [id number]   MEDICAL RECORD NO.:  [id number]          PATIENT TYPE:  INP   LOCATION:  3305                         FACILITY:  [facility]   PHYSICIAN:  James Belfast, PA DATE OF BIRTH:  25-Jun-1950   DATE OF ADMISSION:  09/26/2005  DATE OF DISCHARGE:                                 DISCHARGE SUMMARY   PRIMARY DIAGNOSIS:  Well-differentiated adenocarcinoma with T2 N0 Mx, stage  1B with close margins.   SECONDARY DIAGNOSES:  1.  Hypothyroidism.  2.  Hyperlipidemia.  3.  History of seasonal allergies.   IN-HOSPITAL OPERATIONS AND PROCEDURES:  Right video-assisted thoracoscopic  surgery with right thoracotomy and right upper lobectomy with node  dissection.   PATIENT'S HISTORY AND PHYSICAL AND HOSPITAL COURSE:  The patient is a 60-  year-old gentleman who is a nonsmoker who 2 years ago was found to have a  right upper lobe infiltrate lesion.  It seemed to be very irregular.  A PET  was done at the time and the PET scan was negative with an SGV of 2.  This  was followed per protocol with CT scans at 4 months and 6 months and then he  came back with a CT scan approximately 2 years after the original CT and  there was some slight change.  Because of that, a needle biopsy was  obtained, which showed well-differentiated adenocarcinoma.  Following this,  a right upper lobectomy node dissection was recommended.  The patient was  seen and evaluated by Dr. Edwyna Christian.  Dr. Edwyna Christian discussed with the patient  undergoing surgery.  He discussed the risks and benefits.  The patient  acknowledged his understanding and agreed to proceed.  The surgery was  scheduled for September 26, 2005.   The patient was taken to the operating room September 26, 2005 where he underwent  right video-assisted thoracoscopic surgery with right thoracotomy and right  upper lobectomy with node dissection.  The patient tolerated the procedure  well and was transferred up to the  intensive care unit in stable condition.  Pathology reports came back for nonsmall cancer with stage 1B, T2 N0 Mx with  close margins.   The patient's postoperative course was pretty much unremarkable.  He was  seen to be hemodynamically stable following surgery and remained.  Last  hemoglobin and hematocrit were stable at 12.2 and 35 on postoperative day  #3.  Followup chest x-rays seemed to be stable.  He has had less than 5%  right apical pneumo.  Posterior chest tube was discontinued postoperative  day #1.  Suction was discontinued postoperative day #2.  Followup chest x-  rays following this remained stable with no increase in size of  pneumothorax.  The remaining chest tube was discontinued postoperative day  #3.  Followup chest x-ray was shown to be stable.  AP and lateral chest x-  ray will be obtained in the morning.  The patient was out of bed and  ambulating well postoperatively.  He ws able to be weaned off oxygen,  satting greater than 90% on room air.  He was seen to be afebrile  postoperatively.  Vital signs were monitored and seemed to be stable.  The  incisions were clean, dry, and intact and healing well.  He remained in  normal sinus rhythm.  The patient was using his incentive spirometer well.  The patient was tolerating a regular well.  No nausea or vomiting noted.   The patient is tentatively ready for discharge home postoperative day #4,  September 30, 2005.  A followup appointment will be arranged with Dr. Edwyna Christian in 1  week.  The patient is to obtain a PA and lateral chest x-ray 1 hour prior to  this appointment.  Mr. James Christian received instructions on diet, activity  level, and incisional care.  He was told no driving until released to do so.  No heavy lifting over 20 pounds.  He was told he was allowed to shower,  washing his incisions using soap and water.  He was to contact the office if  develops any drainage or opening from any of his incision sites.  The  patient  was told to ambulate 3 to 4 times per day, progress as tolerated and  to continue his exercises.  The patient was educated on diet to be low-fat,  low-salt.  He acknowledged his understanding.   DISCHARGE MEDICATIONS:  1.  Synthroid 75 mcg daily.  2.  Nasonex 2 sprays each nostril b.i.d.  3.  Pepcid 20 mg daily.  4.  Azmacort 500/20 two tablets at night.  5.  Benicar 20 mg at night.  6.  Allegra as used at home.  7.  Aspirin 325 mg daily.  8.  Xanax as used at home.  9.  Omnicor as used at home.  10. Percocet 5/325 one to two tablets every 4-6 hours p.r.n. pain.      James Belfast, PA     KMD/MEDQ  D:  09/29/2005  T:  09/29/2005  Job:  [id number]   cc:   James Christian, M.D.  [address]  [place]  [place] [address]

## 2010-08-27 NOTE — Op Note (Signed)
NAME:  James Christian, James Christian NO.:  [id number]   MEDICAL RECORD NO.:  [id number]          PATIENT TYPE:  INP   LOCATION:  3305                         FACILITY:  [facility]   PHYSICIAN:  Ines Bloomer, M.D. DATE OF BIRTH:  1951/02/06   DATE OF PROCEDURE:  DATE OF DISCHARGE:                                 OPERATIVE REPORT   PREOPERATIVE DIAGNOSIS:  Non-small cell lung cancer, right upper lobe.   POSTOPERATIVE DIAGNOSIS:  Non-small cell lung cancer, right upper lobe.   PROCEDURE:  Right VATS   Dictation ended at this point.           ______________________________  Ines Bloomer, M.D.     DPB/MEDQ  D:  09/26/2005  T:  09/27/2005  Job:  [id number]

## 2010-08-27 NOTE — Op Note (Signed)
NAME:  ADONNIS, SALCEDA NO.:  [id number]   MEDICAL RECORD NO.:  [id number]          PATIENT TYPE:  INP   LOCATION:  3305                         FACILITY:  [facility]   PHYSICIAN:  Ines Bloomer, M.D. DATE OF BIRTH:  [birth year]   DATE OF PROCEDURE:  DATE OF DISCHARGE:                                 OPERATIVE REPORT   PREOPERATIVE DIAGNOSIS:  Non-small cell cancer, right upper lobe.   POSTOPERATIVE DIAGNOSIS:  Non-small cell cancer, right upper lobe.   PROCEDURE:  Attempted right VATS, right thoracotomy, right upper lobectomy  with node dissection.   SURGEON:  Ines Bloomer, M.D.   FIRST ASSISTANT:  Theda Belfast, P.A.   ANESTHESIA:  General.   DESCRIPTION OF PROCEDURE:  After percutaneous insertion of all monitoring  lines, the patient underwent general anesthesia and was prepped and draped  in the usual sterile manner.  She was turned to the right lateral  thoracotomy position.   Two trocar sites were made in the anterior and posterior axillary line at  the seventh intercostal space.  Two trocars were inserted, but the lung  could not be deflated.  We were worried that the lung was in the wrong spot  because we had marked difficulty in inserting the dual-lumen endotracheal  tube.  We switched to a posterolateral thoracotomy, dividing the latissimus  and flipping the serratus anteriorly, taking down the intercostal muscle to  take a portion of the fifth rib superiorly with the rib shears.  Two  Tuffier's were placed at right angles, and indeed, the dual-lumen tube was  in the right main stem bronchus so we could not do the operation with the  lung deflated.  Dissection was started superiorly, dissecting out several  nodes at the tracheobronchial angle and then dividing two branches of the  apical posterior branch to the right upper lobe with the Autosuture 30 white  roticulator.  We then dissected out the superior middle branch and divided  it with  the Autosuture 30 white roticulator.  Several 10R nodes were  dissected free, and this exposed an anterior branch which was doubly clipped  and divided.   Attention was then turned to the fissure, but with the lung being expanded,  we could not divide the fissure easily and could not get to the pulmonary  artery.  The major and minor fissure were partially divided with the  [facility] 45 roticulator, but since we could not do this, we dissected out  the right upper lobe bronchus and stapled and divided it with the Autosuture  stapler.  This exposed the posterior branch of the right upper lobe which  was doubly ligated with 0 silk, clipped, and divided.  Then using this, we  divided the rest of the fissure with the EZ45 stapler and the Autosuture  stapler.  Part of the fissure had to be oversewn with 3-0 Vicryl.  The  inferior pulmonary ligament was taken down.  After we took the right upper  lobe out, we thought we had close margins near where we had divided across  the middle lobe,  and so we took two secondary margins just to be sure we had  over a centimeter to a centimeter and a half margins. CoSeal was applied to  the staple line.  Two chest tubes were brought into through the trocar site  and tied in place with 0 silk.  Four drill holes were placed through the  sixth rib for the pericostals.  A On-  Q was placed in the usual fashion.  The chest was closed with four  pericostals of #1 Vicryl in the muscle layer, 2-0 Vicryl in the subcutaneous  tissue, and Dermabond for the skin.   The patient returned to the recovery room in stable condition.           ______________________________  Ines Bloomer, M.D.     DPB/MEDQ  D:  09/26/2005  T:  09/27/2005  Job:  [id number]

## 2013-04-16 ENCOUNTER — Encounter (HOSPITAL_BASED_OUTPATIENT_CLINIC_OR_DEPARTMENT_OTHER): Payer: Self-pay | Admitting: *Deleted

## 2013-04-16 ENCOUNTER — Other Ambulatory Visit: Payer: Self-pay | Admitting: Urology

## 2013-04-17 ENCOUNTER — Encounter (HOSPITAL_BASED_OUTPATIENT_CLINIC_OR_DEPARTMENT_OTHER): Payer: Self-pay | Admitting: *Deleted

## 2013-04-17 NOTE — Progress Notes (Addendum)
NPO AFTER MN. ARRIVE AT 0745. NEEDS ISTAT, EKG, KUB AND CXR. WILL TAKE SYNTHROID, TARCEVA AND IF NEEDED ONE TYPE PAIN RX W/ SIPS OF WATER.  PT CALLED TODAY , STATES HE RECEIVED RECENT CHEST XRAY OR CT RESULTS FROM [facility], HE WILL BRING DOS. SO CXR NOT NEEDED.

## 2013-04-19 ENCOUNTER — Encounter (HOSPITAL_BASED_OUTPATIENT_CLINIC_OR_DEPARTMENT_OTHER): Payer: BC Managed Care – PPO | Admitting: Anesthesiology

## 2013-04-19 ENCOUNTER — Encounter (HOSPITAL_BASED_OUTPATIENT_CLINIC_OR_DEPARTMENT_OTHER): Payer: Self-pay | Admitting: *Deleted

## 2013-04-19 ENCOUNTER — Ambulatory Visit (HOSPITAL_COMMUNITY): Payer: BC Managed Care – PPO

## 2013-04-19 ENCOUNTER — Ambulatory Visit (HOSPITAL_BASED_OUTPATIENT_CLINIC_OR_DEPARTMENT_OTHER): Payer: BC Managed Care – PPO | Admitting: Anesthesiology

## 2013-04-19 ENCOUNTER — Ambulatory Visit (HOSPITAL_BASED_OUTPATIENT_CLINIC_OR_DEPARTMENT_OTHER)
Admission: RE | Admit: 2013-04-19 | Discharge: 2013-04-19 | Disposition: A | Discharge status: Home / Self Care | Payer: BC Managed Care – PPO | Source: Ambulatory Visit | Attending: Urology | Admitting: Urology

## 2013-04-19 ENCOUNTER — Encounter (HOSPITAL_BASED_OUTPATIENT_CLINIC_OR_DEPARTMENT_OTHER)
Admission: RE | Disposition: A | Discharge status: Home / Self Care | Payer: Self-pay | Source: Ambulatory Visit | Attending: Urology

## 2013-04-19 ENCOUNTER — Other Ambulatory Visit: Payer: Self-pay

## 2013-04-19 DIAGNOSIS — I10 Essential (primary) hypertension: Secondary | ICD-10-CM | POA: Insufficient documentation

## 2013-04-19 DIAGNOSIS — R972 Elevated prostate specific antigen [PSA]: Secondary | ICD-10-CM | POA: Insufficient documentation

## 2013-04-19 DIAGNOSIS — N1330 Unspecified hydronephrosis: Secondary | ICD-10-CM | POA: Insufficient documentation

## 2013-04-19 DIAGNOSIS — R3129 Other microscopic hematuria: Secondary | ICD-10-CM | POA: Insufficient documentation

## 2013-04-19 DIAGNOSIS — N201 Calculus of ureter: Secondary | ICD-10-CM

## 2013-04-19 DIAGNOSIS — E039 Hypothyroidism, unspecified: Secondary | ICD-10-CM | POA: Insufficient documentation

## 2013-04-19 DIAGNOSIS — Z79899 Other long term (current) drug therapy: Secondary | ICD-10-CM | POA: Insufficient documentation

## 2013-04-19 DIAGNOSIS — E291 Testicular hypofunction: Secondary | ICD-10-CM | POA: Insufficient documentation

## 2013-04-19 DIAGNOSIS — Z86718 Personal history of other venous thrombosis and embolism: Secondary | ICD-10-CM | POA: Insufficient documentation

## 2013-04-19 DIAGNOSIS — E7800 Pure hypercholesterolemia, unspecified: Secondary | ICD-10-CM | POA: Insufficient documentation

## 2013-04-19 DIAGNOSIS — Z85118 Personal history of other malignant neoplasm of bronchus and lung: Secondary | ICD-10-CM | POA: Insufficient documentation

## 2013-04-19 HISTORY — DX: Hypothyroidism, unspecified: E03.9

## 2013-04-19 HISTORY — DX: Cough: R05

## 2013-04-19 HISTORY — DX: Other specified symptoms and signs involving the circulatory and respiratory systems: R09.89

## 2013-04-19 HISTORY — DX: Personal history of other malignant neoplasm of bronchus and lung: Z85.118

## 2013-04-19 HISTORY — DX: Other constipation: K59.09

## 2013-04-19 HISTORY — PX: CYSTOSCOPY WITH URETEROSCOPY AND STENT PLACEMENT: SHX6377

## 2013-04-19 HISTORY — DX: Gastro-esophageal reflux disease without esophagitis: K21.9

## 2013-04-19 HISTORY — DX: Unspecified osteoarthritis, unspecified site: M19.90

## 2013-04-19 HISTORY — DX: Essential (primary) hypertension: I10

## 2013-04-19 HISTORY — DX: Calculus of ureter: N20.1

## 2013-04-19 HISTORY — DX: Presence of spectacles and contact lenses: Z97.3

## 2013-04-19 HISTORY — DX: Other specified cough: R05.8

## 2013-04-19 LAB — POCT I-STAT 4, (NA,K, GLUC, HGB,HCT)
Glucose, Bld: 124 mg/dL — ABNORMAL HIGH (ref 70–99)
HCT: 45 % (ref 39.0–52.0)
Hemoglobin: 15.3 g/dL (ref 13.0–17.0)
Potassium: 3.9 mEq/L (ref 3.7–5.3)
Sodium: 141 mEq/L (ref 137–147)

## 2013-04-19 SURGERY — CYSTOURETEROSCOPY, WITH STENT INSERTION
Anesthesia: General | Site: Ureter | Laterality: Right

## 2013-04-19 MED ORDER — CIPROFLOXACIN IN D5W 200 MG/100ML IV SOLN
200.0000 mg | INTRAVENOUS | Status: AC
  Administered 2013-04-19: 200 mg via INTRAVENOUS
  Filled 2013-04-19 (×2): qty 100

## 2013-04-19 MED ORDER — MIDAZOLAM HCL 5 MG/5ML IJ SOLN
INTRAMUSCULAR | Status: DC | PRN
  Administered 2013-04-19: 2 mg via INTRAVENOUS

## 2013-04-19 MED ORDER — LACTATED RINGERS IV SOLN
INTRAVENOUS | Status: DC
  Administered 2013-04-19: 10:00:00 via INTRAVENOUS
  Filled 2013-04-19: qty 1000

## 2013-04-19 MED ORDER — DEXAMETHASONE SODIUM PHOSPHATE 4 MG/ML IJ SOLN
INTRAMUSCULAR | Status: DC | PRN
  Administered 2013-04-19: 10 mg via INTRAVENOUS

## 2013-04-19 MED ORDER — SODIUM CHLORIDE 0.9 % IR SOLN
Status: DC | PRN
Start: 2013-04-19 — End: 2013-04-19
  Administered 2013-04-19: 1000 mL via INTRAVESICAL

## 2013-04-19 MED ORDER — PROPOFOL 10 MG/ML IV BOLUS
INTRAVENOUS | Status: DC | PRN
  Administered 2013-04-19: 200 mg via INTRAVENOUS

## 2013-04-19 MED ORDER — ONDANSETRON HCL 4 MG/2ML IJ SOLN
INTRAMUSCULAR | Status: DC | PRN
  Administered 2013-04-19: 4 mg via INTRAVENOUS

## 2013-04-19 MED ORDER — PHENAZOPYRIDINE HCL 200 MG PO TABS
200.0000 mg | ORAL_TABLET | Freq: Once | ORAL | Status: AC
Start: 2013-04-19 — End: 2013-04-19
  Administered 2013-04-19: 200 mg via ORAL
  Filled 2013-04-19: qty 1
  Filled 2013-04-19: qty 2

## 2013-04-19 MED ORDER — MIDAZOLAM HCL 2 MG/2ML IJ SOLN
INTRAMUSCULAR | Status: AC
Start: 2013-04-19 — End: 2013-04-19
  Filled 2013-04-19: qty 2

## 2013-04-19 MED ORDER — LACTATED RINGERS IV SOLN
INTRAVENOUS | Status: DC
  Administered 2013-04-19: 08:00:00 via INTRAVENOUS
  Filled 2013-04-19: qty 1000

## 2013-04-19 MED ORDER — FENTANYL CITRATE 0.05 MG/ML IJ SOLN
INTRAMUSCULAR | Status: AC
  Filled 2013-04-19: qty 2

## 2013-04-19 MED ORDER — EPHEDRINE SULFATE 50 MG/ML IJ SOLN
INTRAMUSCULAR | Status: DC | PRN
  Administered 2013-04-19: 10 mg via INTRAVENOUS
  Administered 2013-04-19: 5 mg via INTRAVENOUS

## 2013-04-19 MED ORDER — FENTANYL CITRATE 0.05 MG/ML IJ SOLN
25.0000 ug | INTRAMUSCULAR | Status: DC | PRN
  Filled 2013-04-19: qty 1

## 2013-04-19 MED ORDER — FENTANYL CITRATE 0.05 MG/ML IJ SOLN
INTRAMUSCULAR | Status: DC | PRN
  Administered 2013-04-19: 50 ug via INTRAVENOUS

## 2013-04-19 MED ORDER — IOHEXOL 350 MG/ML SOLN
INTRAVENOUS | Status: DC | PRN
  Administered 2013-04-19: 2 mL via INTRAVENOUS

## 2013-04-19 MED ORDER — LIDOCAINE HCL (CARDIAC) 20 MG/ML IV SOLN
INTRAVENOUS | Status: DC | PRN
  Administered 2013-04-19: 60 mg via INTRAVENOUS

## 2013-04-19 SURGICAL SUPPLY — 40 items
ADAPTER CATH URET PLST 4-6FR (CATHETERS) IMPLANT
ADPR CATH URET STRL DISP 4-6FR (CATHETERS)
BAG DRAIN URO-CYSTO SKYTR STRL (DRAIN) ×3 IMPLANT
BAG DRN UROCATH (DRAIN) ×2
BASKET LASER NITINOL 1.9FR (BASKET) IMPLANT
BASKET STNLS GEMINI 4WIRE 3FR (BASKET) IMPLANT
BASKET ZERO TIP NITINOL 2.4FR (BASKET) IMPLANT
BRUSH URET BIOPSY 3F (UROLOGICAL SUPPLIES) IMPLANT
BSKT STON RTRVL 120 1.9FR (BASKET)
BSKT STON RTRVL GEM 120X11 3FR (BASKET)
BSKT STON RTRVL ZERO TP 2.4FR (BASKET)
CANISTER SUCT LVC 12 LTR MEDI- (MISCELLANEOUS) ×2 IMPLANT
CATH INTERMIT  6FR 70CM (CATHETERS) ×3 IMPLANT
CATH URET 5FR 28IN CONE TIP (BALLOONS)
CATH URET 5FR 70CM CONE TIP (BALLOONS) IMPLANT
CLOTH BEACON ORANGE TIMEOUT ST (SAFETY) ×1 IMPLANT
DRAPE CAMERA CLOSED 9X96 (DRAPES) ×3 IMPLANT
ELECT REM PT RETURN 9FT ADLT (ELECTROSURGICAL)
ELECTRODE REM PT RTRN 9FT ADLT (ELECTROSURGICAL) IMPLANT
FIBER LASER FLEXIVA 200 (UROLOGICAL SUPPLIES) IMPLANT
FIBER LASER FLEXIVA 365 (UROLOGICAL SUPPLIES) IMPLANT
FIBER LASER FLEXIVA 550 (UROLOGICAL SUPPLIES) IMPLANT
GLOVE BIO SURGEON STRL SZ8 (GLOVE) ×3 IMPLANT
GLOVE INDICATOR 6.5 STRL GRN (GLOVE) ×4 IMPLANT
GOWN PREVENTION PLUS LG XLONG (DISPOSABLE) ×3 IMPLANT
GOWN STRL REIN XL XLG (GOWN DISPOSABLE) ×3 IMPLANT
GUIDEWIRE 0.038 PTFE COATED (WIRE) IMPLANT
GUIDEWIRE ANG ZIPWIRE 038X150 (WIRE) IMPLANT
GUIDEWIRE STR DUAL SENSOR (WIRE) ×3 IMPLANT
IV NS IRRIG 3000ML ARTHROMATIC (IV SOLUTION) ×3 IMPLANT
KIT BALLIN UROMAX 15FX10 (LABEL) IMPLANT
KIT BALLN UROMAX 15FX4 (MISCELLANEOUS) IMPLANT
KIT BALLN UROMAX 26 75X4 (MISCELLANEOUS)
PACK CYSTOSCOPY (CUSTOM PROCEDURE TRAY) ×3 IMPLANT
SET HIGH PRES BAL DIL (LABEL)
SHEATH ACCESS URETERAL 38CM (SHEATH) IMPLANT
SHEATH ACCESS URETERAL 54CM (SHEATH) IMPLANT
SHEATH URET ACCESS 12FR/35CM (UROLOGICAL SUPPLIES) IMPLANT
SHEATH URET ACCESS 12FR/55CM (UROLOGICAL SUPPLIES) IMPLANT
WATER STERILE IRR 3000ML UROMA (IV SOLUTION) ×3 IMPLANT

## 2013-04-19 NOTE — Anesthesia Preprocedure Evaluation (Addendum)
Anesthesia Evaluation  Patient identified by MRN, date of birth, ID band Patient awake    Reviewed: Allergy & Precautions, H&P , NPO status , Patient's Chart, lab work & pertinent test results  Airway Mallampati: II TM Distance: >3 FB Neck ROM: full    Dental no notable dental hx. (+) Teeth Intact and Dental Advisory Given   Pulmonary  Lung cancer.  Lobectomy 2007.  Recurrent 2011 breath sounds clear to auscultation  Pulmonary exam normal       Cardiovascular Exercise Tolerance: Good hypertension, Pt. on medications Rhythm:regular Rate:Normal     Neuro/Psych negative neurological ROS  negative psych ROS   GI/Hepatic negative GI ROS, Neg liver ROS, GERD-  Medicated and Controlled,  Endo/Other  negative endocrine ROSHypothyroidism   Renal/GU negative Renal ROS  negative genitourinary   Musculoskeletal   Abdominal   Peds  Hematology negative hematology ROS (+)   Anesthesia Other Findings   Reproductive/Obstetrics negative OB ROS                          Anesthesia Physical Anesthesia Plan  ASA: III  Anesthesia Plan: General   Post-op Pain Management:    Induction: Intravenous  Airway Management Planned: LMA  Additional Equipment:   Intra-op Plan:   Post-operative Plan:   Informed Consent: I have reviewed the patients History and Physical, chart, labs and discussed the procedure including the risks, benefits and alternatives for the proposed anesthesia with the patient or authorized representative who has indicated his/her understanding and acceptance.   Dental Advisory Given  Plan Discussed with: CRNA and Surgeon  Anesthesia Plan Comments:         Anesthesia Quick Evaluation

## 2013-04-19 NOTE — Op Note (Signed)
PATIENT:  James Christian  PRE-OPERATIVE DIAGNOSIS: Right distal ureteral stone  POST-OPERATIVE DIAGNOSIS: Same  PROCEDURE: Cystoscopy with right retrograde pyelogram including interpretation  SURGEON:  Claybon Jabs  INDICATION: James Christian is a 63 year old male who underwent CT scan in 12/14 to evaluate microscopic hematuria. He was placed on medical expulsive therapy but continued to show the presence of a stone in the area of the distal ureter on KUB. We therefore discussed ureteroscopic management. Brought to the operating room for that today. He has not been having any pain. His preop KUB did not reveal a definite stone present at this time. We discussed evaluating with retrograde pyelography today to be absolutely sure the stone has passed.  ANESTHESIA:  General  EBL:  Minimal  DRAINS: None  LOCAL MEDICATIONS USED:  None  SPECIMEN: None   Description of procedure: After informed consent the patient was taken to the operating room and placed on the table in a supine position. General anesthesia was then administered. Once fully anesthetized the patient was moved to the dorsal lithotomy position and the genitalia were sterilely prepped and draped in standard fashion. An official timeout was then performed.  The 16 French cystoscope was then passed under direct vision down the urethra. Ureter was noted be normal as was the prostate. The bladder was then entered and fully inspected. It was noted be free of any tumors, stones or inflammatory lesions. The right orifice was identified. It appeared to be mildly edematous and mildly erythematous consistent with the presence of or recent passage of a stone.  A 6 French open-ended ureteral catheter was then passed through the cystoscope and a right retrograde pyelogram was performed in the standard fashion by injecting full-strength contrast under direct fluoroscopic visualization. This revealed no evidence of filling defect within the  ureter and no hydronephrosis. I therefore concluded he had passed a stone. The bladder was drained and the patient was awakened and taken to the recovery room.   PLAN OF CARE: Discharge to home after PACU  PATIENT DISPOSITION:  PACU - hemodynamically stable.

## 2013-04-19 NOTE — Anesthesia Postprocedure Evaluation (Signed)
  Anesthesia Post-op Note  Patient: James Christian  Procedure(s) Performed: Procedure(s) (LRB): Cystoscopy, Right Retrograde Pyelogram (Right)  Patient Location: PACU  Anesthesia Type: General  Level of Consciousness: awake and alert   Airway and Oxygen Therapy: Patient Spontanous Breathing  Post-op Pain: mild  Post-op Assessment: Post-op Vital signs reviewed, Patient's Cardiovascular Status Stable, Respiratory Function Stable, Patent Airway and No signs of Nausea or vomiting  Last Vitals:  Filed Vitals:   04/19/13 0930  BP: 100/59  Pulse: 66  Temp:   Resp: 13    Post-op Vital Signs: stable   Complications: No apparent anesthesia complications

## 2013-04-19 NOTE — Discharge Instructions (Signed)

## 2013-04-19 NOTE — Anesthesia Procedure Notes (Signed)
Procedure Name: LMA Insertion Date/Time: 04/19/2013 8:52 AM Performed by: Denna Haggard D Pre-anesthesia Checklist: Patient identified, Emergency Drugs available, Suction available and Patient being monitored Patient Re-evaluated:Patient Re-evaluated prior to inductionOxygen Delivery Method: Circle System Utilized Preoxygenation: Pre-oxygenation with 100% oxygen Intubation Type: IV induction Ventilation: Mask ventilation without difficulty LMA: LMA inserted LMA Size: 4.0 Number of attempts: 1 Airway Equipment and Method: bite block Placement Confirmation: positive ETCO2 Tube secured with: Tape Dental Injury: Teeth and Oropharynx as per pre-operative assessment

## 2013-04-19 NOTE — Transfer of Care (Signed)
Immediate Anesthesia Transfer of Care Note  Patient: James Christian  Procedure(s) Performed: Procedure(s) (LRB): Cystoscopy, Right Retrograde Pyelogram (Right)  Patient Location: PACU  Anesthesia Type: General  Level of Consciousness: awake, oriented, sedated and patient cooperative  Airway & Oxygen Therapy: Patient Spontanous Breathing and Patient connected to face mask oxygen  Post-op Assessment: Report given to PACU RN and Post -op Vital signs reviewed and stable  Post vital signs: Reviewed and stable  Complications: No apparent anesthesia complications

## 2013-04-19 NOTE — H&P (Signed)
History of Present Illness Nephrolithiasis: A CT in 4/12 revealed mild right hydronephrosis due to a stone at the ureterovesical junction that was described as tiny. The patient subsequently passed the stone. I reviewed the films and noted no other renal calculi present in either right or left kidney.  CT scan 12/14 revealed no renal calculi and a left ureteral stone.     Hypogonadism: He has been managed by Dr. Dagmar Hait for this with testosterone replacement with normalization of his testosterone levels.    Microscopic hematuria: He was evaluated for this in 12/14 with CT scan and cystoscopy. The source was found to be a left ureteral stone.    Elevated PSA: He was placed on testosterone and PSA had been monitored and has noted to have risen to 6.2.  PSAs  4/12 - 3.20  4/13 - 3.85  5/14 - 4.76  5/14 - 6.2  TRUS/BX 03/20/13: His prostate measured 36 cc.  Pathology: Benign.    Ureteral stone: He was found to have a 3.5 mm nonobstructing stone in the mid to upper left ureter on CT scan on 03/13/13. He has not seen any stone pass. He's not had any significant pain. He remains on medical expulsive therapy.    Interval history:   Past Medical History Problems  1. History of Gout (274.9) 2. History of hypercholesterolemia (V12.29) 3. History of hypertension (V12.59) 4. History of hypothyroidism (V12.29) 5. History of Lung Cancer (V10.11) 6. 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 History of Biopsy Of The Prostate Needle 2. History of Lung Surgery  Current Meds 1. Amoxicillin-Pot Clavulanate 875-125 MG Oral Tablet;  Therapy: [id number] to Recorded 2. Azor 5-40 MG Oral Tablet;  Therapy: (Recorded:05Jul2012) to Recorded 3. Lovaza CAPS;  Therapy: (Recorded:05Jul2012) to Recorded 4. Minocycline HCl - 50 MG Oral Capsule;  Therapy: (Recorded:05Jul2012) to Recorded 5. OxyCODONE HCl - 5 MG Oral  Capsule;  Therapy: (Recorded:05Jul2012) to Recorded 6. OxyCONTIN 10 MG Oral Tablet ER 12 Hour Abuse-Deterrent;  Therapy: [phone] to Recorded 7. Pepcid AC TABS;  Therapy: (Recorded:05Jul2012) to Recorded 8. Senokot TABS;  Therapy: (Recorded:05Jul2012) to Recorded 9. Simcor 1000-20 MG Oral Tablet Extended Release 24 Hour;  Therapy: (Recorded:05Jul2012) to Recorded 10. Synthroid 75 MCG Oral Tablet;   Therapy: (Recorded:05Jul2012) to Recorded 11. Tarceva 150 MG Oral Tablet;   Therapy: (Recorded:05Jul2012) to Recorded 12. Tums CHEW;   Therapy: (Recorded:05Jul2012) to Recorded 13. Vitamin D3 2000 UNIT Oral Tablet;   Therapy: (Recorded:05Jul2012) to Recorded  Allergies Medication  1. No Known Drug Allergies  Family History Problems  1. Family history of Family Health Status Children ___ Living Daughters   1 2. Family history of Family Health Status Children ___ Living Sons   2 3. Family history of Family Health Status Of Father - Alive 4. Family history of Family Health Status Of Mother - Alive 5. Family history of hypertension (V17.49) : Mother 6. Family history of Nephrolithiasis  Social History Problems  1. Alcohol Use   occasional/rare 2. Caffeine Use   1 3. Marital History - Currently Married 4. Never A Smoker 5. Occupation:   Research scientist (life sciences) 6. Denied: History of Tobacco Use   Vitals Vital Signs  Height: 5 ft 8 in Weight: 205 lb  BMI Calculated: 31.17 BSA Calculated: 2.07 Blood Pressure: 135 / 79 Temperature: 97.8 F Heart Rate: 68 Blood Pressure: 112 / 76 Temperature: 97.5 F Heart Rate: 57  Review of Systems Genitourinary, constitutional,  skin, eye, otolaryngeal, hematologic/lymphatic, cardiovascular, pulmonary, endocrine, musculoskeletal, gastrointestinal, neurological and psychiatric system(s) were reviewed and pertinent findings if present are noted.     Physical Exam Constitutional: Well nourished and well developed . No acute distress.   ENT:. The ears and nose are normal in appearance.  Neck: The appearance of the neck is normal and no neck mass is present.  Pulmonary: No respiratory distress and normal respiratory rhythm and effort.  Cardiovascular: Heart rate and rhythm are normal . No peripheral edema.  Abdomen: The abdomen is soft and nontender. No masses are palpated. No CVA tenderness. No hernias are palpable. No hepatosplenomegaly noted.  Rectal: Rectal exam demonstrates normal sphincter tone, no tenderness and no masses. The prostate has no nodularity, is indurated involving the mid aspect of the prostate and is not tender. The left seminal vesicle is nonpalpable. The right seminal vesicle is nonpalpable. The perineum is normal on inspection.  Genitourinary: Examination of the penis demonstrates no discharge, no masses, no lesions and a normal meatus. The scrotum is without lesions. The right epididymis is palpably normal and non-tender. The left epididymis is palpably normal and non-tender. The right testis is non-tender and without masses. The left testis is non-tender and without masses.  Lymphatics: The femoral and inguinal nodes are not enlarged or tender.  Skin: Normal skin turgor, no visible rash and no visible skin lesions.  Neuro/Psych:. Mood and affect are appropriate.    The following images/tracing/specimen were independently visualized:  KUB: His stone remains unchanged in position in the distal right ureter.  The following clinical lab reports were reviewed:  UA: Red cells were noted but it was otherwise negative.    Assessment  His stone has not progressed. He is not having any significant pain today. We therefore discussed the treatment options. I did discuss lithotripsy but the stones size and location would lend itself better to ureteroscopy. I therefore have discussed ureteroscopy with detail. We discussed the procedure, its risks and complications, the anticipated probability of success as well as the  postoperative course. He also understands the possible need for a stent postoperatively. I have answered all of his questions and he was elected to proceed with ureteroscopic extraction of his distal right ureteral stone.    Plan He will be scheduled for right ureteroscopy with stone extraction and possible laser lithotripsy as an outpatient.

## 2013-04-22 ENCOUNTER — Encounter (HOSPITAL_BASED_OUTPATIENT_CLINIC_OR_DEPARTMENT_OTHER): Payer: Self-pay | Admitting: Urology

## 2013-04-30 ENCOUNTER — Ambulatory Visit (HOSPITAL_COMMUNITY)
Admission: RE | Admit: 2013-04-30 | Discharge: 2013-04-30 | Disposition: A | Discharge status: Home / Self Care | Payer: BC Managed Care – PPO | Source: Ambulatory Visit | Attending: Vascular Surgery | Admitting: Vascular Surgery

## 2013-04-30 ENCOUNTER — Other Ambulatory Visit (HOSPITAL_COMMUNITY): Payer: Self-pay | Admitting: Family Medicine

## 2013-04-30 DIAGNOSIS — I824Z9 Acute embolism and thrombosis of unspecified deep veins of unspecified distal lower extremity: Secondary | ICD-10-CM | POA: Insufficient documentation

## 2013-04-30 DIAGNOSIS — Z85118 Personal history of other malignant neoplasm of bronchus and lung: Secondary | ICD-10-CM | POA: Insufficient documentation

## 2013-04-30 DIAGNOSIS — M7989 Other specified soft tissue disorders: Secondary | ICD-10-CM

## 2013-04-30 DIAGNOSIS — Z86718 Personal history of other venous thrombosis and embolism: Secondary | ICD-10-CM | POA: Insufficient documentation

## 2013-04-30 DIAGNOSIS — M79609 Pain in unspecified limb: Secondary | ICD-10-CM

## 2013-04-30 DIAGNOSIS — I824Y9 Acute embolism and thrombosis of unspecified deep veins of unspecified proximal lower extremity: Secondary | ICD-10-CM | POA: Insufficient documentation

## 2013-04-30 DIAGNOSIS — R609 Edema, unspecified: Secondary | ICD-10-CM | POA: Insufficient documentation

## 2013-11-19 ENCOUNTER — Ambulatory Visit (INDEPENDENT_AMBULATORY_CARE_PROVIDER_SITE_OTHER): Payer: BC Managed Care – PPO

## 2013-11-19 VITALS — BP 130/75 | HR 56 | Resp 14 | Ht 68.0 in | Wt 210.0 lb

## 2013-11-19 DIAGNOSIS — M19071 Primary osteoarthritis, right ankle and foot: Secondary | ICD-10-CM

## 2013-11-19 DIAGNOSIS — M79609 Pain in unspecified limb: Secondary | ICD-10-CM

## 2013-11-19 DIAGNOSIS — M19079 Primary osteoarthritis, unspecified ankle and foot: Secondary | ICD-10-CM

## 2013-11-19 DIAGNOSIS — M2021 Hallux rigidus, right foot: Secondary | ICD-10-CM

## 2013-11-19 DIAGNOSIS — M2020 Hallux rigidus, unspecified foot: Secondary | ICD-10-CM

## 2013-11-19 DIAGNOSIS — M79671 Pain in right foot: Secondary | ICD-10-CM

## 2013-11-19 DIAGNOSIS — M722 Plantar fascial fibromatosis: Secondary | ICD-10-CM

## 2013-11-19 MED ORDER — TRIAMCINOLONE ACETONIDE 10 MG/ML IJ SUSP: 10.0000 mg | Freq: Once | INTRAMUSCULAR | Status: AC

## 2013-11-19 NOTE — Patient Instructions (Signed)
ICE INSTRUCTIONS  Apply ice or cold pack to the affected area at least 3 times a day for 10-15 minutes each time.  You should also use ice after prolonged activity or vigorous exercise.  Do not apply ice longer than 20 minutes at one time.  Always keep a cloth between your skin and the ice pack to prevent burns.  Being consistent and following these instructions will help control your symptoms.  We suggest you purchase a gel ice pack because they are reusable and do bit leak.  Some of them are designed to wrap around the area.  Use the method that works best for you.  Here are some other suggestions for icing.   Use a frozen bag of peas or corn-inexpensive and molds well to your body, usually stays frozen for 10 to 20 minutes.  Wet a towel with cold water and squeeze out the excess until it's damp.  Place in a bag in the freezer for 20 minutes. Then remove and use.   Plantar Fasciitis Plantar fasciitis is a common condition that causes foot pain. It is soreness (inflammation) of the band of tough fibrous tissue on the bottom of the foot that runs from the heel bone (calcaneus) to the ball of the foot. The cause of this soreness may be from excessive standing, poor fitting shoes, running on hard surfaces, being overweight, having an abnormal walk, or overuse (this is common in runners) of the painful foot or feet. It is also common in aerobic exercise dancers and ballet dancers. SYMPTOMS  Most people with plantar fasciitis complain of:  Severe pain in the morning on the bottom of their foot especially when taking the first steps out of bed. This pain recedes after a few minutes of walking.  Severe pain is experienced also during walking following a long period of inactivity.  Pain is worse when walking barefoot or up stairs DIAGNOSIS   Your caregiver will diagnose this condition by examining and feeling your foot.  Special tests such as X-rays of your foot, are usually not needed. PREVENTION     Consult a sports medicine professional before beginning a new exercise program.  Walking programs offer a good workout. With walking there is a lower chance of overuse injuries common to runners. There is less impact and less jarring of the joints.  Begin all new exercise programs slowly. If problems or pain develop, decrease the amount of time or distance until you are at a comfortable level.  Wear good shoes and replace them regularly.  Stretch your foot and the heel cords at the back of the ankle (Achilles tendon) both before and after exercise.  Run or exercise on even surfaces that are not hard. For example, asphalt is better than pavement.  Do not run barefoot on hard surfaces.  If using a treadmill, vary the incline.  Do not continue to workout if you have foot or joint problems. Seek professional help if they do not improve. HOME CARE INSTRUCTIONS   Avoid activities that cause you pain until you recover.  Use ice or cold packs on the problem or painful areas after working out.  Only take over-the-counter or prescription medicines for pain, discomfort, or fever as directed by your caregiver.  Soft shoe inserts or athletic shoes with air or gel sole cushions may be helpful.  If problems continue or become more severe, consult a sports medicine caregiver or your own health care provider. Cortisone is a potent anti-inflammatory medication that may  be injected into the painful area. You can discuss this treatment with your caregiver. MAKE SURE YOU:   Understand these instructions.  Will watch your condition.  Will get help right away if you are not doing well or get worse. Document Released: 12/21/2000 Document Revised: 06/20/2011 Document Reviewed: 02/20/2008 [facility] Patient Information 2015 [place], [place]. This information is not intended to replace advice given to you by your health care provider. Make sure you discuss any questions you have with your health care  provider.

## 2013-11-19 NOTE — Progress Notes (Signed)
   Subjective:    Patient ID: James Christian, male    DOB: July 21, 1950, 63 y.o.   MRN: [id number]  HPI Comments: N heel pain L right plantar posterior heel D 2 - 3 months O sudden C sharp pain A standing  T Dr Dagmar Hait referred to [facility]     Review of Systems  HENT: Positive for congestion.   Respiratory: Positive for cough.        Lung cancer currently being treated at [facility].  Hematological:       Currently on Lovenox treatment  All other systems reviewed and are negative.      Objective:   Physical Exam 63 year old white male well-developed well-nourished oriented x3 presents this time with pain inferior right heel. Been going on for several months with increasing severity of pain walking activities etc. Patient does have a history of lung cancer x5 years currently being treated some Lovenox for recent blood clot in his right leg. Lower extremity objective findings as follows vascular status is intact with pedal pulses palpable DP postal for PT one over 4 bilateral capillary refill time 3 seconds epicritic and proprioceptive sensations intact and symmetric toe plantar response DTRs not elicited dermatologically skin color pigment normal hair growth absent nails somewhat criptotic orthopedic biomechanical exam there is pain on palpation of the medial band of plantar fascia medial calcaneal tubercle on the right no inferior no retrocalcaneal pain on the inferior arch pain mostly the calcaneal insertion of the plantar fascia. X-rays demonstrate mild fascial thickening no inferior spurring no fractures no cyst or tumor noted clinical evaluation also reveals a hallux limitus/rigidus deformity with hypertrophy of first MTP joint with pain crepitus and limited range of motion of right great toe joint. It is likely is causing gait disturbance be putting stress on the fascia in heel right.       Assessment & Plan:  Assessment plantar fasciitis/heel spur syndrome right foot also mild osteo-arthrosis  and hallux rigidus right foot as well necrosis in the future date. At this time patient placed in the injection tender with Kenalog 20 with a Xylocaine plain to the inferior right heel fascial strapping is applied and will be maintained for 5 day's as instructed. Also recommended ice to the area and Tylenol if needed for pain avoid NSAIDs or aspirin therapies due to anticoagulant medication her Lovenox we'll recheck in 2-3 weeks for followup recommended ice patient ambulating in Birkenstock shoes both in outside the house advised to continue maintain a good shoe like Birkenstock reassess in 2-3 weeks as indicated  Harriet Masson DPM

## 2013-12-03 ENCOUNTER — Ambulatory Visit (INDEPENDENT_AMBULATORY_CARE_PROVIDER_SITE_OTHER): Payer: BC Managed Care – PPO

## 2013-12-03 VITALS — BP 137/76 | HR 64 | Resp 12

## 2013-12-03 DIAGNOSIS — M79609 Pain in unspecified limb: Secondary | ICD-10-CM

## 2013-12-03 DIAGNOSIS — M2020 Hallux rigidus, unspecified foot: Secondary | ICD-10-CM

## 2013-12-03 DIAGNOSIS — M2021 Hallux rigidus, right foot: Secondary | ICD-10-CM

## 2013-12-03 DIAGNOSIS — M722 Plantar fascial fibromatosis: Secondary | ICD-10-CM

## 2013-12-03 DIAGNOSIS — M79671 Pain in right foot: Secondary | ICD-10-CM

## 2013-12-03 NOTE — Patient Instructions (Signed)

## 2013-12-03 NOTE — Progress Notes (Signed)
   Subjective:    Patient ID: James Christian, male    DOB: [birth year], 63 y.o.   MRN: [id number]  HPI ''RT FOOT IS MUCH BETTER BUT LITTLE TENDER.''   Review of Systems no new findings or systemic changes noted     Objective:   Physical Exam Lower extremity objective findings as follows patient has pedal pulses palpable epicritic sensation is intact DP postal for PT one over 4 bilateral patient will taping was on the distal sore however since the taping chemotherapy has had improvement in the inferior right heel is wearing Birkenstocks and his cast Birkenstock insoles have been helping. Remainder of exam unremarkable patient does have fascial thickening inferior calcaneal spurring was not noted. There is also hallux rigidus arthropathy the foot with abnormality gait. Patient would benefit from functional orthoses either OTC or prescription orthotic in the future we'll obtain authorization from his carrier however in the interim issued OTC power step orthotic for patient use       Assessment & Plan:  Assessment plantar fasciitis/heel spur syndrome gait abnormality with hallux rigidus OTC orthotics are dispensed may be candidate for prescription orthoses in the future followup in as needed for 3 months if no significant improvement next  Harriet Masson DPM

## 2015-06-27 ENCOUNTER — Emergency Department (INDEPENDENT_AMBULATORY_CARE_PROVIDER_SITE_OTHER)
Admission: EM | Admit: 2015-06-27 | Discharge: 2015-06-27 | Disposition: A | Discharge status: Home / Self Care | Payer: 59 | Source: Home / Self Care | Attending: Emergency Medicine | Admitting: Emergency Medicine

## 2015-06-27 ENCOUNTER — Other Ambulatory Visit (HOSPITAL_COMMUNITY)
Admission: RE | Admit: 2015-06-27 | Discharge: 2015-06-27 | Disposition: A | Discharge status: Home / Self Care | Payer: 59 | Source: Ambulatory Visit | Attending: Emergency Medicine | Admitting: Emergency Medicine

## 2015-06-27 ENCOUNTER — Encounter (HOSPITAL_COMMUNITY): Payer: Self-pay | Admitting: Emergency Medicine

## 2015-06-27 DIAGNOSIS — J029 Acute pharyngitis, unspecified: Secondary | ICD-10-CM

## 2015-06-27 LAB — POCT RAPID STREP A: Streptococcus, Group A Screen (Direct): NEGATIVE

## 2015-06-27 MED ORDER — AMOXICILLIN 500 MG PO CAPS: 500.0000 mg | ORAL_CAPSULE | Freq: Three times a day (TID) | ORAL | Status: AC

## 2015-06-27 NOTE — ED Notes (Signed)
Pt d/c by Dr. Bridgett Larsson

## 2015-06-27 NOTE — ED Provider Notes (Addendum)
CSN: [id number]     Arrival date & time 06/27/15  1306 History   First MD Initiated Contact with Patient 06/27/15 1344     Chief Complaint  Patient presents with  . Sore Throat   (Consider location/radiation/quality/duration/timing/severity/associated sxs/prior Treatment) HPI  He is a 65 year old man here for evaluation of sore throat. He states he has some chronic problems with throat irritation, for which she uses Flonase. Last night the sore throat got worse. He also noticed a left submandibular lymph node that was slightly swollen and tender. He does have lung cancer and is currently taking Tarceva. He denies any fevers. No nasal congestion or rhinorrhea. He does report some nasal dryness. No cough or shortness of breath. No difficulty swallowing.  Past Medical History  Diagnosis Date  . Right ureteral stone   . History of lung cancer ONCOLOGIST-  [place]---  NO RADIATION OR CHEMOTHERAPY OTHER THAN CURRENTLY TAKING ORAL TARCEVA    2007--  NON-SMALL CELL CARCINOMA S/P RIGHT UPPER LOBECTOMY---  RECURRENT 2011  STARTED ON TARCEVA--  NO RECURRENCY SINCE  . Hypertension   . Hypothyroidism   . GERD (gastroesophageal reflux disease)   . Productive cough   . Chest congestion     RECENT URI--  FINISHED ONE ROUND ANTIBIOTICS  . Constipation, chronic   . Wears contact lenses   . Arthritis    Past Surgical History  Procedure Laterality Date  . Thoracotomy/lobectomy Right 09-26-2005    UPPER LOBE W/  NODE DISSECTION (NON-SMALL CELL CA)  . Video assisted thoracoscopy (vats)/wedge resection Right 2009  &  2011  [facility]    (2009--  NON-CANCER)   (2011--  RECURRENT NON-SMALL CELL CA)  . Cystoscopy with ureteroscopy and stent placement Right 04/19/2013    Procedure: Cystoscopy, Right Retrograde Pyelogram;  Surgeon: Claybon Jabs, MD;  Location: [facility];  Service: Urology;  Laterality: Right;   Family History  Problem Relation Age of Onset  . Hypertension Mother   .  Hypertension Father    Social History  Substance Use Topics  . Smoking status: Never Smoker   . Smokeless tobacco: Never Used  . Alcohol Use: Yes     Comment: OCCASIONAL    Review of Systems As in history of present illness Allergies  Review of patient's allergies indicates no known allergies.  Home Medications   Prior to Admission medications   Medication Sig Start Date End Date Taking? Authorizing Provider  amLODipine-olmesartan (AZOR) 5-40 MG per tablet Take 1 tablet by mouth every evening.   Yes Historical Provider, MD  erlotinib (TARCEVA) 25 MG tablet Take 50 mg by mouth daily. Take on an empty stomach 1 hour before meals or 2 hours after.   Yes Historical Provider, MD  famotidine (PEPCID AC) 10 MG chewable tablet Chew 10 mg by mouth 2 (two) times daily as needed for heartburn.   Yes Historical Provider, MD  levothyroxine (SYNTHROID, LEVOTHROID) 75 MCG tablet Take 75 mcg by mouth daily before breakfast.   Yes Historical Provider, MD  amoxicillin (AMOXIL) 500 MG capsule Take 1 capsule (500 mg total) by mouth 3 (three) times daily. 06/27/15   Melony Overly, MD  dextromethorphan-guaiFENesin (MUCINEX DM) 30-600 MG per 12 hr tablet Take 1 tablet by mouth 2 (two) times daily.    Historical Provider, MD  Enoxaparin Sodium (LOVENOX [place]) Inject into the skin.    Historical Provider, MD  Levofloxacin (LEVAQUIN PO) Take by mouth.    Historical Provider, MD  Niacin,  Antihyperlipidemic, (NIASPAN PO) Take 1 tablet by mouth daily.    Historical Provider, MD  oxycodone (OXY-IR) 5 MG capsule Take 5 mg by mouth every 4 (four) hours as needed.    Historical Provider, MD  OxyCODONE (OXYCONTIN) 10 mg T12A 12 hr tablet Take 10 mg by mouth at bedtime as needed.    Historical Provider, MD  oxyCODONE-acetaminophen (PERCOCET/ROXICET) 5-325 MG per tablet Take 1 tablet by mouth every 4 (four) hours as needed for severe pain.    Historical Provider, MD  vitamin E 400 UNIT capsule Take 400 Units by mouth daily.     Historical Provider, MD   Meds Ordered and Administered this Visit  Medications - No data to display  BP 155/81 mmHg  Pulse 62  Temp(Src) 98.1 F (36.7 C) (Oral)  Resp 18  SpO2 98% No data found.   Physical Exam  Constitutional: He is oriented to person, place, and time. He appears well-developed and well-nourished. No distress.  HENT:  Mouth/Throat: No oropharyngeal exudate.  Oropharyngeal erythema. He does have a fair amount of postnasal drainage.  Neck: Neck supple.  Cardiovascular: Normal rate, regular rhythm and normal heart sounds.   No murmur heard. Pulmonary/Chest: Effort normal and breath sounds normal. No respiratory distress. He has no wheezes. He has no rales.  Lymphadenopathy:    He has cervical adenopathy (minimally swollen and tender left submandibular).  Neurological: He is alert and oriented to person, place, and time.    ED Course  Procedures (including critical care time)  Labs Review Labs Reviewed  POCT RAPID STREP A    Imaging Review No results found.   MDM   1. Pharyngitis    Rapid strep is negative. Throat culture sent. Given that he is on an oral chemotherapy agent, will treat presumptively with amoxicillin. If culture is negative, he can stop the antibiotic. Follow-up as needed.  I have verbally reviewed the discharge instructions with the patient. A printed AVS was given to the patient.  All questions were answered prior to discharge.    Melony Overly, MD 06/27/15 [place], MD 06/27/15 [phone]

## 2015-06-27 NOTE — Discharge Instructions (Signed)
Your strep test is negative. The culture will take about 2 days to come back. With your medical history, please start the amoxicillin as prescribed. We will call you with your culture results. If they are negative, you can stop the antibiotics. If you have not heard from [place] by Tuesday, please give [place] a call for your results. Follow-up as needed.

## 2015-06-27 NOTE — ED Notes (Signed)
C/o ST onset yest night associated w/submandibular swelling .... Denies fevers A&O x4... No acute distress.

## 2015-06-30 LAB — CULTURE, GROUP A STREP (THRC)

## 2016-04-13 DIAGNOSIS — I10 Essential (primary) hypertension: Secondary | ICD-10-CM | POA: Diagnosis not present

## 2016-04-13 DIAGNOSIS — E784 Other hyperlipidemia: Secondary | ICD-10-CM | POA: Diagnosis not present

## 2016-04-13 DIAGNOSIS — E038 Other specified hypothyroidism: Secondary | ICD-10-CM | POA: Diagnosis not present

## 2016-04-13 DIAGNOSIS — R8299 Other abnormal findings in urine: Secondary | ICD-10-CM | POA: Diagnosis not present

## 2016-04-13 DIAGNOSIS — E119 Type 2 diabetes mellitus without complications: Secondary | ICD-10-CM | POA: Diagnosis not present

## 2016-04-18 DIAGNOSIS — Z0000 Encounter for general adult medical examination without abnormal findings: Secondary | ICD-10-CM | POA: Diagnosis not present

## 2016-04-18 DIAGNOSIS — C3490 Malignant neoplasm of unspecified part of unspecified bronchus or lung: Secondary | ICD-10-CM | POA: Diagnosis not present

## 2016-04-18 DIAGNOSIS — Z1389 Encounter for screening for other disorder: Secondary | ICD-10-CM | POA: Diagnosis not present

## 2016-04-18 DIAGNOSIS — I10 Essential (primary) hypertension: Secondary | ICD-10-CM | POA: Diagnosis not present

## 2016-04-18 DIAGNOSIS — E1165 Type 2 diabetes mellitus with hyperglycemia: Secondary | ICD-10-CM | POA: Diagnosis not present

## 2016-05-09 DIAGNOSIS — I10 Essential (primary) hypertension: Secondary | ICD-10-CM | POA: Diagnosis not present

## 2016-05-09 DIAGNOSIS — E1165 Type 2 diabetes mellitus with hyperglycemia: Secondary | ICD-10-CM | POA: Diagnosis not present

## 2016-06-06 DIAGNOSIS — C3410 Malignant neoplasm of upper lobe, unspecified bronchus or lung: Secondary | ICD-10-CM | POA: Diagnosis not present

## 2016-06-06 DIAGNOSIS — G893 Neoplasm related pain (acute) (chronic): Secondary | ICD-10-CM | POA: Diagnosis not present

## 2016-06-06 DIAGNOSIS — M792 Neuralgia and neuritis, unspecified: Secondary | ICD-10-CM | POA: Diagnosis not present

## 2016-08-08 DIAGNOSIS — I10 Essential (primary) hypertension: Secondary | ICD-10-CM | POA: Diagnosis not present

## 2016-08-08 DIAGNOSIS — E1165 Type 2 diabetes mellitus with hyperglycemia: Secondary | ICD-10-CM | POA: Diagnosis not present

## 2016-08-08 DIAGNOSIS — C3490 Malignant neoplasm of unspecified part of unspecified bronchus or lung: Secondary | ICD-10-CM | POA: Diagnosis not present

## 2016-09-06 DIAGNOSIS — C3491 Malignant neoplasm of unspecified part of right bronchus or lung: Secondary | ICD-10-CM | POA: Diagnosis not present

## 2016-09-06 DIAGNOSIS — C3411 Malignant neoplasm of upper lobe, right bronchus or lung: Secondary | ICD-10-CM | POA: Diagnosis not present

## 2016-09-07 DIAGNOSIS — G4701 Insomnia due to medical condition: Secondary | ICD-10-CM | POA: Diagnosis not present

## 2016-09-07 DIAGNOSIS — Z515 Encounter for palliative care: Secondary | ICD-10-CM | POA: Diagnosis not present

## 2016-09-07 DIAGNOSIS — M792 Neuralgia and neuritis, unspecified: Secondary | ICD-10-CM | POA: Diagnosis not present

## 2016-11-30 DIAGNOSIS — Z7901 Long term (current) use of anticoagulants: Secondary | ICD-10-CM | POA: Diagnosis not present

## 2016-11-30 DIAGNOSIS — I824Y9 Acute embolism and thrombosis of unspecified deep veins of unspecified proximal lower extremity: Secondary | ICD-10-CM | POA: Diagnosis not present

## 2016-12-16 DIAGNOSIS — C3490 Malignant neoplasm of unspecified part of unspecified bronchus or lung: Secondary | ICD-10-CM | POA: Diagnosis not present

## 2016-12-16 DIAGNOSIS — I10 Essential (primary) hypertension: Secondary | ICD-10-CM | POA: Diagnosis not present

## 2016-12-16 DIAGNOSIS — E1165 Type 2 diabetes mellitus with hyperglycemia: Secondary | ICD-10-CM | POA: Diagnosis not present

## 2017-02-15 DIAGNOSIS — M1712 Unilateral primary osteoarthritis, left knee: Secondary | ICD-10-CM | POA: Diagnosis not present

## 2017-02-24 DIAGNOSIS — M1712 Unilateral primary osteoarthritis, left knee: Secondary | ICD-10-CM | POA: Diagnosis not present

## 2017-03-21 DIAGNOSIS — R911 Solitary pulmonary nodule: Secondary | ICD-10-CM | POA: Diagnosis not present

## 2017-03-21 DIAGNOSIS — C3411 Malignant neoplasm of upper lobe, right bronchus or lung: Secondary | ICD-10-CM | POA: Diagnosis not present

## 2017-03-21 DIAGNOSIS — C3491 Malignant neoplasm of unspecified part of right bronchus or lung: Secondary | ICD-10-CM | POA: Diagnosis not present

## 2017-03-21 DIAGNOSIS — Z902 Acquired absence of lung [part of]: Secondary | ICD-10-CM | POA: Diagnosis not present

## 2017-04-18 DIAGNOSIS — C342 Malignant neoplasm of middle lobe, bronchus or lung: Secondary | ICD-10-CM | POA: Diagnosis not present

## 2017-04-18 DIAGNOSIS — C3491 Malignant neoplasm of unspecified part of right bronchus or lung: Secondary | ICD-10-CM | POA: Diagnosis not present

## 2017-04-21 DIAGNOSIS — C342 Malignant neoplasm of middle lobe, bronchus or lung: Secondary | ICD-10-CM | POA: Diagnosis not present

## 2017-04-27 DIAGNOSIS — C342 Malignant neoplasm of middle lobe, bronchus or lung: Secondary | ICD-10-CM | POA: Diagnosis not present

## 2017-04-27 DIAGNOSIS — C3491 Malignant neoplasm of unspecified part of right bronchus or lung: Secondary | ICD-10-CM | POA: Diagnosis not present

## 2017-05-04 DIAGNOSIS — C342 Malignant neoplasm of middle lobe, bronchus or lung: Secondary | ICD-10-CM | POA: Diagnosis not present

## 2017-05-08 DIAGNOSIS — C3491 Malignant neoplasm of unspecified part of right bronchus or lung: Secondary | ICD-10-CM | POA: Diagnosis not present

## 2017-05-15 DIAGNOSIS — C3491 Malignant neoplasm of unspecified part of right bronchus or lung: Secondary | ICD-10-CM | POA: Diagnosis not present

## 2017-05-15 DIAGNOSIS — C3411 Malignant neoplasm of upper lobe, right bronchus or lung: Secondary | ICD-10-CM | POA: Diagnosis not present

## 2017-05-15 DIAGNOSIS — C342 Malignant neoplasm of middle lobe, bronchus or lung: Secondary | ICD-10-CM | POA: Diagnosis not present

## 2017-05-18 DIAGNOSIS — C342 Malignant neoplasm of middle lobe, bronchus or lung: Secondary | ICD-10-CM | POA: Diagnosis not present

## 2017-05-18 DIAGNOSIS — C3411 Malignant neoplasm of upper lobe, right bronchus or lung: Secondary | ICD-10-CM | POA: Diagnosis not present

## 2017-05-31 DIAGNOSIS — Z7901 Long term (current) use of anticoagulants: Secondary | ICD-10-CM | POA: Diagnosis not present

## 2017-05-31 DIAGNOSIS — M1712 Unilateral primary osteoarthritis, left knee: Secondary | ICD-10-CM | POA: Diagnosis not present

## 2017-05-31 DIAGNOSIS — I82599 Chronic embolism and thrombosis of other specified deep vein of unspecified lower extremity: Secondary | ICD-10-CM | POA: Diagnosis not present

## 2017-06-23 DIAGNOSIS — M25562 Pain in left knee: Secondary | ICD-10-CM | POA: Diagnosis not present

## 2017-06-23 DIAGNOSIS — C3491 Malignant neoplasm of unspecified part of right bronchus or lung: Secondary | ICD-10-CM | POA: Diagnosis not present

## 2017-06-23 DIAGNOSIS — G8929 Other chronic pain: Secondary | ICD-10-CM | POA: Diagnosis not present

## 2017-06-27 DIAGNOSIS — R82998 Other abnormal findings in urine: Secondary | ICD-10-CM | POA: Diagnosis not present

## 2017-06-27 DIAGNOSIS — E291 Testicular hypofunction: Secondary | ICD-10-CM | POA: Diagnosis not present

## 2017-06-27 DIAGNOSIS — E119 Type 2 diabetes mellitus without complications: Secondary | ICD-10-CM | POA: Diagnosis not present

## 2017-06-27 DIAGNOSIS — E038 Other specified hypothyroidism: Secondary | ICD-10-CM | POA: Diagnosis not present

## 2017-06-27 DIAGNOSIS — E559 Vitamin D deficiency, unspecified: Secondary | ICD-10-CM | POA: Diagnosis not present

## 2017-07-04 DIAGNOSIS — Z0000 Encounter for general adult medical examination without abnormal findings: Secondary | ICD-10-CM | POA: Diagnosis not present

## 2017-07-04 DIAGNOSIS — C3490 Malignant neoplasm of unspecified part of unspecified bronchus or lung: Secondary | ICD-10-CM | POA: Diagnosis not present

## 2017-07-04 DIAGNOSIS — Z1389 Encounter for screening for other disorder: Secondary | ICD-10-CM | POA: Diagnosis not present

## 2017-07-04 DIAGNOSIS — I10 Essential (primary) hypertension: Secondary | ICD-10-CM | POA: Diagnosis not present

## 2017-07-04 DIAGNOSIS — Z23 Encounter for immunization: Secondary | ICD-10-CM | POA: Diagnosis not present

## 2017-07-04 DIAGNOSIS — E1165 Type 2 diabetes mellitus with hyperglycemia: Secondary | ICD-10-CM | POA: Diagnosis not present

## 2017-07-25 DIAGNOSIS — C3411 Malignant neoplasm of upper lobe, right bronchus or lung: Secondary | ICD-10-CM | POA: Diagnosis not present

## 2017-07-25 DIAGNOSIS — Z7901 Long term (current) use of anticoagulants: Secondary | ICD-10-CM | POA: Diagnosis not present

## 2017-07-25 DIAGNOSIS — C3491 Malignant neoplasm of unspecified part of right bronchus or lung: Secondary | ICD-10-CM | POA: Diagnosis not present

## 2017-07-25 DIAGNOSIS — R918 Other nonspecific abnormal finding of lung field: Secondary | ICD-10-CM | POA: Diagnosis not present

## 2017-07-27 DIAGNOSIS — K148 Other diseases of tongue: Secondary | ICD-10-CM | POA: Diagnosis not present

## 2017-08-01 DIAGNOSIS — K140 Glossitis: Secondary | ICD-10-CM | POA: Diagnosis not present

## 2017-08-01 DIAGNOSIS — D3702 Neoplasm of uncertain behavior of tongue: Secondary | ICD-10-CM | POA: Diagnosis not present

## 2017-08-01 DIAGNOSIS — K148 Other diseases of tongue: Secondary | ICD-10-CM | POA: Diagnosis not present

## 2017-08-01 DIAGNOSIS — Z7901 Long term (current) use of anticoagulants: Secondary | ICD-10-CM | POA: Diagnosis not present

## 2017-08-01 DIAGNOSIS — C3411 Malignant neoplasm of upper lobe, right bronchus or lung: Secondary | ICD-10-CM | POA: Diagnosis not present

## 2017-08-15 DIAGNOSIS — C189 Malignant neoplasm of colon, unspecified: Secondary | ICD-10-CM | POA: Diagnosis not present

## 2017-08-15 DIAGNOSIS — C342 Malignant neoplasm of middle lobe, bronchus or lung: Secondary | ICD-10-CM | POA: Diagnosis not present

## 2017-08-15 DIAGNOSIS — C3491 Malignant neoplasm of unspecified part of right bronchus or lung: Secondary | ICD-10-CM | POA: Diagnosis not present

## 2017-08-15 DIAGNOSIS — E279 Disorder of adrenal gland, unspecified: Secondary | ICD-10-CM | POA: Diagnosis not present

## 2017-08-15 DIAGNOSIS — G8912 Acute post-thoracotomy pain: Secondary | ICD-10-CM | POA: Diagnosis not present

## 2017-08-15 DIAGNOSIS — K148 Other diseases of tongue: Secondary | ICD-10-CM | POA: Diagnosis not present

## 2017-08-15 DIAGNOSIS — C3411 Malignant neoplasm of upper lobe, right bronchus or lung: Secondary | ICD-10-CM | POA: Diagnosis not present

## 2017-08-15 DIAGNOSIS — J700 Acute pulmonary manifestations due to radiation: Secondary | ICD-10-CM | POA: Diagnosis not present

## 2017-08-17 DIAGNOSIS — C3411 Malignant neoplasm of upper lobe, right bronchus or lung: Secondary | ICD-10-CM | POA: Diagnosis not present

## 2017-08-17 DIAGNOSIS — E441 Mild protein-calorie malnutrition: Secondary | ICD-10-CM | POA: Diagnosis not present

## 2017-08-17 DIAGNOSIS — Z01818 Encounter for other preprocedural examination: Secondary | ICD-10-CM | POA: Diagnosis not present

## 2017-08-21 DIAGNOSIS — Z713 Dietary counseling and surveillance: Secondary | ICD-10-CM | POA: Diagnosis not present

## 2017-08-25 DIAGNOSIS — D3702 Neoplasm of uncertain behavior of tongue: Secondary | ICD-10-CM | POA: Diagnosis not present

## 2017-08-25 DIAGNOSIS — C021 Malignant neoplasm of border of tongue: Secondary | ICD-10-CM | POA: Diagnosis not present

## 2017-08-25 DIAGNOSIS — Z7901 Long term (current) use of anticoagulants: Secondary | ICD-10-CM | POA: Diagnosis not present

## 2017-08-25 DIAGNOSIS — Z85118 Personal history of other malignant neoplasm of bronchus and lung: Secondary | ICD-10-CM | POA: Diagnosis not present

## 2017-08-25 DIAGNOSIS — K148 Other diseases of tongue: Secondary | ICD-10-CM | POA: Diagnosis not present

## 2017-08-29 DIAGNOSIS — D500 Iron deficiency anemia secondary to blood loss (chronic): Secondary | ICD-10-CM | POA: Diagnosis not present

## 2017-08-29 DIAGNOSIS — D3702 Neoplasm of uncertain behavior of tongue: Secondary | ICD-10-CM | POA: Diagnosis not present

## 2017-08-29 DIAGNOSIS — C3411 Malignant neoplasm of upper lobe, right bronchus or lung: Secondary | ICD-10-CM | POA: Diagnosis not present

## 2017-08-29 DIAGNOSIS — D6489 Other specified anemias: Secondary | ICD-10-CM | POA: Diagnosis not present

## 2017-08-29 DIAGNOSIS — C3490 Malignant neoplasm of unspecified part of unspecified bronchus or lung: Secondary | ICD-10-CM | POA: Diagnosis not present

## 2017-08-31 DIAGNOSIS — D508 Other iron deficiency anemias: Secondary | ICD-10-CM | POA: Diagnosis not present

## 2017-08-31 DIAGNOSIS — C3411 Malignant neoplasm of upper lobe, right bronchus or lung: Secondary | ICD-10-CM | POA: Diagnosis not present

## 2017-09-08 DIAGNOSIS — C3411 Malignant neoplasm of upper lobe, right bronchus or lung: Secondary | ICD-10-CM | POA: Diagnosis not present

## 2017-09-08 DIAGNOSIS — D508 Other iron deficiency anemias: Secondary | ICD-10-CM | POA: Diagnosis not present

## 2017-09-12 DIAGNOSIS — D649 Anemia, unspecified: Secondary | ICD-10-CM | POA: Diagnosis not present

## 2017-09-12 DIAGNOSIS — E279 Disorder of adrenal gland, unspecified: Secondary | ICD-10-CM | POA: Diagnosis not present

## 2017-09-12 DIAGNOSIS — C3411 Malignant neoplasm of upper lobe, right bronchus or lung: Secondary | ICD-10-CM | POA: Diagnosis not present

## 2017-09-12 DIAGNOSIS — G8912 Acute post-thoracotomy pain: Secondary | ICD-10-CM | POA: Diagnosis not present

## 2017-09-12 DIAGNOSIS — D3702 Neoplasm of uncertain behavior of tongue: Secondary | ICD-10-CM | POA: Diagnosis not present

## 2017-09-12 DIAGNOSIS — Z7901 Long term (current) use of anticoagulants: Secondary | ICD-10-CM | POA: Diagnosis not present

## 2017-10-17 DIAGNOSIS — C3411 Malignant neoplasm of upper lobe, right bronchus or lung: Secondary | ICD-10-CM | POA: Diagnosis not present

## 2017-10-17 DIAGNOSIS — D3702 Neoplasm of uncertain behavior of tongue: Secondary | ICD-10-CM | POA: Diagnosis not present

## 2017-10-17 DIAGNOSIS — K149 Disease of tongue, unspecified: Secondary | ICD-10-CM | POA: Diagnosis not present

## 2017-10-17 DIAGNOSIS — E279 Disorder of adrenal gland, unspecified: Secondary | ICD-10-CM | POA: Diagnosis not present

## 2017-10-17 DIAGNOSIS — C3491 Malignant neoplasm of unspecified part of right bronchus or lung: Secondary | ICD-10-CM | POA: Diagnosis not present

## 2017-10-25 DIAGNOSIS — D239 Other benign neoplasm of skin, unspecified: Secondary | ICD-10-CM | POA: Diagnosis not present

## 2017-10-25 DIAGNOSIS — E1165 Type 2 diabetes mellitus with hyperglycemia: Secondary | ICD-10-CM | POA: Diagnosis not present

## 2017-10-25 DIAGNOSIS — M79604 Pain in right leg: Secondary | ICD-10-CM | POA: Diagnosis not present

## 2017-10-27 DIAGNOSIS — M1712 Unilateral primary osteoarthritis, left knee: Secondary | ICD-10-CM | POA: Diagnosis not present

## 2017-10-27 DIAGNOSIS — M1611 Unilateral primary osteoarthritis, right hip: Secondary | ICD-10-CM | POA: Diagnosis not present

## 2017-10-27 DIAGNOSIS — M25551 Pain in right hip: Secondary | ICD-10-CM | POA: Diagnosis not present

## 2017-11-03 DIAGNOSIS — D3702 Neoplasm of uncertain behavior of tongue: Secondary | ICD-10-CM | POA: Diagnosis not present

## 2017-11-04 DIAGNOSIS — M1611 Unilateral primary osteoarthritis, right hip: Secondary | ICD-10-CM | POA: Diagnosis not present

## 2017-11-07 ENCOUNTER — Other Ambulatory Visit (HOSPITAL_COMMUNITY): Payer: Self-pay | Admitting: General Surgery

## 2017-11-07 ENCOUNTER — Other Ambulatory Visit (HOSPITAL_COMMUNITY): Payer: Self-pay | Admitting: Specialist

## 2017-11-07 DIAGNOSIS — M25551 Pain in right hip: Secondary | ICD-10-CM

## 2017-11-08 DIAGNOSIS — M1611 Unilateral primary osteoarthritis, right hip: Secondary | ICD-10-CM | POA: Diagnosis not present

## 2017-11-16 ENCOUNTER — Encounter (HOSPITAL_COMMUNITY): Payer: 59

## 2017-11-16 ENCOUNTER — Encounter (HOSPITAL_COMMUNITY): Admission: RE | Admit: 2017-11-16 | Payer: 59 | Source: Ambulatory Visit

## 2017-11-16 DIAGNOSIS — C342 Malignant neoplasm of middle lobe, bronchus or lung: Secondary | ICD-10-CM | POA: Diagnosis not present

## 2017-11-16 DIAGNOSIS — Z85118 Personal history of other malignant neoplasm of bronchus and lung: Secondary | ICD-10-CM | POA: Diagnosis not present

## 2017-11-16 DIAGNOSIS — D480 Neoplasm of uncertain behavior of bone and articular cartilage: Secondary | ICD-10-CM | POA: Diagnosis not present

## 2017-11-16 DIAGNOSIS — C7951 Secondary malignant neoplasm of bone: Secondary | ICD-10-CM | POA: Diagnosis not present

## 2017-11-16 DIAGNOSIS — C3411 Malignant neoplasm of upper lobe, right bronchus or lung: Secondary | ICD-10-CM | POA: Diagnosis not present

## 2017-11-20 DIAGNOSIS — M1611 Unilateral primary osteoarthritis, right hip: Secondary | ICD-10-CM | POA: Diagnosis not present

## 2017-11-20 DIAGNOSIS — C7951 Secondary malignant neoplasm of bone: Secondary | ICD-10-CM | POA: Diagnosis not present

## 2017-11-21 DIAGNOSIS — C7951 Secondary malignant neoplasm of bone: Secondary | ICD-10-CM | POA: Diagnosis not present

## 2017-11-21 DIAGNOSIS — D480 Neoplasm of uncertain behavior of bone and articular cartilage: Secondary | ICD-10-CM | POA: Diagnosis not present

## 2017-11-21 DIAGNOSIS — C3491 Malignant neoplasm of unspecified part of right bronchus or lung: Secondary | ICD-10-CM | POA: Diagnosis not present

## 2017-11-22 DIAGNOSIS — C7951 Secondary malignant neoplasm of bone: Secondary | ICD-10-CM | POA: Diagnosis not present

## 2017-11-23 DIAGNOSIS — C7951 Secondary malignant neoplasm of bone: Secondary | ICD-10-CM | POA: Diagnosis not present

## 2017-11-24 DIAGNOSIS — C7951 Secondary malignant neoplasm of bone: Secondary | ICD-10-CM | POA: Diagnosis not present

## 2017-11-27 DIAGNOSIS — C7951 Secondary malignant neoplasm of bone: Secondary | ICD-10-CM | POA: Diagnosis not present

## 2017-11-28 DIAGNOSIS — C7951 Secondary malignant neoplasm of bone: Secondary | ICD-10-CM | POA: Diagnosis not present

## 2017-11-29 DIAGNOSIS — I824Y9 Acute embolism and thrombosis of unspecified deep veins of unspecified proximal lower extremity: Secondary | ICD-10-CM | POA: Diagnosis not present

## 2017-11-29 DIAGNOSIS — D649 Anemia, unspecified: Secondary | ICD-10-CM | POA: Diagnosis not present

## 2017-11-29 DIAGNOSIS — Z7901 Long term (current) use of anticoagulants: Secondary | ICD-10-CM | POA: Diagnosis not present

## 2017-11-30 DIAGNOSIS — C7951 Secondary malignant neoplasm of bone: Secondary | ICD-10-CM | POA: Diagnosis not present

## 2017-12-01 DIAGNOSIS — C7951 Secondary malignant neoplasm of bone: Secondary | ICD-10-CM | POA: Diagnosis not present

## 2017-12-04 DIAGNOSIS — C7951 Secondary malignant neoplasm of bone: Secondary | ICD-10-CM | POA: Diagnosis not present

## 2017-12-05 DIAGNOSIS — C7951 Secondary malignant neoplasm of bone: Secondary | ICD-10-CM | POA: Diagnosis not present

## 2017-12-06 DIAGNOSIS — C7951 Secondary malignant neoplasm of bone: Secondary | ICD-10-CM | POA: Diagnosis not present

## 2017-12-25 DIAGNOSIS — R918 Other nonspecific abnormal finding of lung field: Secondary | ICD-10-CM | POA: Diagnosis not present

## 2017-12-25 DIAGNOSIS — G8912 Acute post-thoracotomy pain: Secondary | ICD-10-CM | POA: Diagnosis not present

## 2017-12-25 DIAGNOSIS — C3411 Malignant neoplasm of upper lobe, right bronchus or lung: Secondary | ICD-10-CM | POA: Diagnosis not present

## 2017-12-25 DIAGNOSIS — C3490 Malignant neoplasm of unspecified part of unspecified bronchus or lung: Secondary | ICD-10-CM | POA: Diagnosis not present

## 2018-01-05 DIAGNOSIS — R05 Cough: Secondary | ICD-10-CM | POA: Diagnosis not present

## 2018-01-16 DIAGNOSIS — C3490 Malignant neoplasm of unspecified part of unspecified bronchus or lung: Secondary | ICD-10-CM | POA: Diagnosis not present

## 2018-01-16 DIAGNOSIS — R918 Other nonspecific abnormal finding of lung field: Secondary | ICD-10-CM | POA: Diagnosis not present

## 2018-01-16 DIAGNOSIS — D3702 Neoplasm of uncertain behavior of tongue: Secondary | ICD-10-CM | POA: Diagnosis not present

## 2018-01-16 DIAGNOSIS — C7971 Secondary malignant neoplasm of right adrenal gland: Secondary | ICD-10-CM | POA: Diagnosis not present

## 2018-01-16 DIAGNOSIS — D508 Other iron deficiency anemias: Secondary | ICD-10-CM | POA: Diagnosis not present

## 2018-01-16 DIAGNOSIS — C3491 Malignant neoplasm of unspecified part of right bronchus or lung: Secondary | ICD-10-CM | POA: Diagnosis not present

## 2018-01-19 DIAGNOSIS — D508 Other iron deficiency anemias: Secondary | ICD-10-CM | POA: Diagnosis not present

## 2018-01-19 DIAGNOSIS — C3410 Malignant neoplasm of upper lobe, unspecified bronchus or lung: Secondary | ICD-10-CM | POA: Diagnosis not present

## 2018-01-23 DIAGNOSIS — C801 Malignant (primary) neoplasm, unspecified: Secondary | ICD-10-CM | POA: Diagnosis not present

## 2018-01-26 DIAGNOSIS — C3410 Malignant neoplasm of upper lobe, unspecified bronchus or lung: Secondary | ICD-10-CM | POA: Diagnosis not present

## 2018-01-26 DIAGNOSIS — E2749 Other adrenocortical insufficiency: Secondary | ICD-10-CM | POA: Diagnosis not present

## 2018-01-26 DIAGNOSIS — E278 Other specified disorders of adrenal gland: Secondary | ICD-10-CM | POA: Diagnosis not present

## 2018-01-26 DIAGNOSIS — C029 Malignant neoplasm of tongue, unspecified: Secondary | ICD-10-CM | POA: Diagnosis not present

## 2018-01-26 DIAGNOSIS — Z85118 Personal history of other malignant neoplasm of bronchus and lung: Secondary | ICD-10-CM | POA: Diagnosis not present

## 2018-02-02 DIAGNOSIS — D3702 Neoplasm of uncertain behavior of tongue: Secondary | ICD-10-CM | POA: Diagnosis not present

## 2018-02-06 DIAGNOSIS — C3411 Malignant neoplasm of upper lobe, right bronchus or lung: Secondary | ICD-10-CM | POA: Diagnosis not present

## 2018-02-06 DIAGNOSIS — C3491 Malignant neoplasm of unspecified part of right bronchus or lung: Secondary | ICD-10-CM | POA: Diagnosis not present

## 2018-02-06 DIAGNOSIS — G8912 Acute post-thoracotomy pain: Secondary | ICD-10-CM | POA: Diagnosis not present

## 2019-04-12 DIAGNOSIS — C7931 Secondary malignant neoplasm of brain: Secondary | ICD-10-CM | POA: Diagnosis not present

## 2019-04-12 DIAGNOSIS — C3411 Malignant neoplasm of upper lobe, right bronchus or lung: Secondary | ICD-10-CM | POA: Diagnosis not present

## 2019-04-12 DIAGNOSIS — C3491 Malignant neoplasm of unspecified part of right bronchus or lung: Secondary | ICD-10-CM | POA: Diagnosis not present

## 2019-04-12 DIAGNOSIS — I4891 Unspecified atrial fibrillation: Secondary | ICD-10-CM | POA: Diagnosis not present

## 2019-04-12 DIAGNOSIS — J189 Pneumonia, unspecified organism: Secondary | ICD-10-CM | POA: Diagnosis not present

## 2019-04-12 DIAGNOSIS — R4182 Altered mental status, unspecified: Secondary | ICD-10-CM | POA: Diagnosis not present

## 2019-04-13 DIAGNOSIS — J189 Pneumonia, unspecified organism: Secondary | ICD-10-CM | POA: Diagnosis not present

## 2019-04-13 DIAGNOSIS — R739 Hyperglycemia, unspecified: Secondary | ICD-10-CM | POA: Diagnosis not present

## 2019-04-13 DIAGNOSIS — C7931 Secondary malignant neoplasm of brain: Secondary | ICD-10-CM | POA: Diagnosis not present

## 2019-04-13 DIAGNOSIS — R4182 Altered mental status, unspecified: Secondary | ICD-10-CM | POA: Diagnosis not present

## 2019-04-13 DIAGNOSIS — C3411 Malignant neoplasm of upper lobe, right bronchus or lung: Secondary | ICD-10-CM | POA: Diagnosis not present

## 2019-04-13 DIAGNOSIS — R7303 Prediabetes: Secondary | ICD-10-CM | POA: Diagnosis not present

## 2019-04-13 DIAGNOSIS — C3491 Malignant neoplasm of unspecified part of right bronchus or lung: Secondary | ICD-10-CM | POA: Diagnosis not present

## 2019-04-13 DIAGNOSIS — T380X5A Adverse effect of glucocorticoids and synthetic analogues, initial encounter: Secondary | ICD-10-CM | POA: Diagnosis not present

## 2019-04-13 DIAGNOSIS — I4891 Unspecified atrial fibrillation: Secondary | ICD-10-CM | POA: Diagnosis not present

## 2019-04-15 DIAGNOSIS — R4182 Altered mental status, unspecified: Secondary | ICD-10-CM | POA: Diagnosis not present

## 2019-04-15 DIAGNOSIS — G969 Disorder of central nervous system, unspecified: Secondary | ICD-10-CM | POA: Diagnosis not present

## 2019-04-15 DIAGNOSIS — T380X5A Adverse effect of glucocorticoids and synthetic analogues, initial encounter: Secondary | ICD-10-CM | POA: Diagnosis not present

## 2019-04-15 DIAGNOSIS — C3411 Malignant neoplasm of upper lobe, right bronchus or lung: Secondary | ICD-10-CM | POA: Diagnosis not present

## 2019-04-15 DIAGNOSIS — I480 Paroxysmal atrial fibrillation: Secondary | ICD-10-CM | POA: Diagnosis not present

## 2019-04-15 DIAGNOSIS — R739 Hyperglycemia, unspecified: Secondary | ICD-10-CM | POA: Diagnosis not present

## 2019-04-15 DIAGNOSIS — I4891 Unspecified atrial fibrillation: Secondary | ICD-10-CM | POA: Diagnosis not present

## 2019-04-15 DIAGNOSIS — C3491 Malignant neoplasm of unspecified part of right bronchus or lung: Secondary | ICD-10-CM | POA: Diagnosis not present

## 2019-04-15 DIAGNOSIS — R7303 Prediabetes: Secondary | ICD-10-CM | POA: Diagnosis not present

## 2019-04-15 DIAGNOSIS — J189 Pneumonia, unspecified organism: Secondary | ICD-10-CM | POA: Diagnosis not present

## 2019-04-15 DIAGNOSIS — C7931 Secondary malignant neoplasm of brain: Secondary | ICD-10-CM | POA: Diagnosis not present

## 2019-04-15 DIAGNOSIS — E871 Hypo-osmolality and hyponatremia: Secondary | ICD-10-CM | POA: Diagnosis not present

## 2019-04-15 DIAGNOSIS — R836 Abnormal cytological findings in cerebrospinal fluid: Secondary | ICD-10-CM | POA: Diagnosis not present

## 2019-04-17 DIAGNOSIS — C3411 Malignant neoplasm of upper lobe, right bronchus or lung: Secondary | ICD-10-CM | POA: Diagnosis not present

## 2019-04-17 DIAGNOSIS — C7931 Secondary malignant neoplasm of brain: Secondary | ICD-10-CM | POA: Diagnosis not present

## 2019-04-17 DIAGNOSIS — I480 Paroxysmal atrial fibrillation: Secondary | ICD-10-CM | POA: Diagnosis not present

## 2019-04-17 DIAGNOSIS — E871 Hypo-osmolality and hyponatremia: Secondary | ICD-10-CM | POA: Diagnosis not present

## 2019-04-17 DIAGNOSIS — C3491 Malignant neoplasm of unspecified part of right bronchus or lung: Secondary | ICD-10-CM | POA: Diagnosis not present

## 2019-04-17 DIAGNOSIS — G969 Disorder of central nervous system, unspecified: Secondary | ICD-10-CM | POA: Diagnosis not present

## 2019-04-19 DIAGNOSIS — R739 Hyperglycemia, unspecified: Secondary | ICD-10-CM | POA: Diagnosis not present

## 2019-04-19 DIAGNOSIS — D509 Iron deficiency anemia, unspecified: Secondary | ICD-10-CM | POA: Diagnosis not present

## 2019-04-19 DIAGNOSIS — C7931 Secondary malignant neoplasm of brain: Secondary | ICD-10-CM | POA: Diagnosis not present

## 2019-04-19 DIAGNOSIS — G936 Cerebral edema: Secondary | ICD-10-CM | POA: Diagnosis not present

## 2019-04-19 DIAGNOSIS — Z20822 Contact with and (suspected) exposure to covid-19: Secondary | ICD-10-CM | POA: Diagnosis not present

## 2019-04-19 DIAGNOSIS — I10 Essential (primary) hypertension: Secondary | ICD-10-CM | POA: Diagnosis not present

## 2019-04-19 DIAGNOSIS — C3491 Malignant neoplasm of unspecified part of right bronchus or lung: Secondary | ICD-10-CM | POA: Diagnosis not present

## 2019-04-19 DIAGNOSIS — D696 Thrombocytopenia, unspecified: Secondary | ICD-10-CM | POA: Diagnosis not present

## 2019-04-19 DIAGNOSIS — J189 Pneumonia, unspecified organism: Secondary | ICD-10-CM | POA: Diagnosis not present

## 2019-04-19 DIAGNOSIS — G060 Intracranial abscess and granuloma: Secondary | ICD-10-CM | POA: Diagnosis not present

## 2019-04-19 DIAGNOSIS — I480 Paroxysmal atrial fibrillation: Secondary | ICD-10-CM | POA: Diagnosis not present

## 2019-04-19 DIAGNOSIS — G9389 Other specified disorders of brain: Secondary | ICD-10-CM | POA: Diagnosis not present

## 2019-04-19 DIAGNOSIS — R911 Solitary pulmonary nodule: Secondary | ICD-10-CM | POA: Diagnosis not present

## 2019-04-19 DIAGNOSIS — R011 Cardiac murmur, unspecified: Secondary | ICD-10-CM | POA: Diagnosis not present

## 2019-04-19 DIAGNOSIS — C801 Malignant (primary) neoplasm, unspecified: Secondary | ICD-10-CM | POA: Diagnosis not present

## 2019-04-19 DIAGNOSIS — A310 Pulmonary mycobacterial infection: Secondary | ICD-10-CM | POA: Diagnosis not present

## 2019-04-19 DIAGNOSIS — Z01818 Encounter for other preprocedural examination: Secondary | ICD-10-CM | POA: Diagnosis not present

## 2019-04-19 DIAGNOSIS — G939 Disorder of brain, unspecified: Secondary | ICD-10-CM | POA: Diagnosis not present

## 2019-04-19 DIAGNOSIS — C3490 Malignant neoplasm of unspecified part of unspecified bronchus or lung: Secondary | ICD-10-CM | POA: Diagnosis not present

## 2019-04-19 DIAGNOSIS — R918 Other nonspecific abnormal finding of lung field: Secondary | ICD-10-CM | POA: Diagnosis not present

## 2019-04-19 DIAGNOSIS — B9689 Other specified bacterial agents as the cause of diseases classified elsewhere: Secondary | ICD-10-CM | POA: Diagnosis not present

## 2019-04-19 DIAGNOSIS — E039 Hypothyroidism, unspecified: Secondary | ICD-10-CM | POA: Diagnosis not present

## 2019-04-19 DIAGNOSIS — R05 Cough: Secondary | ICD-10-CM | POA: Diagnosis not present

## 2019-04-19 DIAGNOSIS — C3411 Malignant neoplasm of upper lobe, right bronchus or lung: Secondary | ICD-10-CM | POA: Diagnosis not present

## 2019-04-19 DIAGNOSIS — A319 Mycobacterial infection, unspecified: Secondary | ICD-10-CM | POA: Diagnosis not present

## 2019-04-19 DIAGNOSIS — T380X5A Adverse effect of glucocorticoids and synthetic analogues, initial encounter: Secondary | ICD-10-CM | POA: Diagnosis not present

## 2019-04-25 DIAGNOSIS — G9389 Other specified disorders of brain: Secondary | ICD-10-CM | POA: Diagnosis not present

## 2019-04-25 DIAGNOSIS — G939 Disorder of brain, unspecified: Secondary | ICD-10-CM | POA: Diagnosis not present

## 2019-04-25 DIAGNOSIS — C3490 Malignant neoplasm of unspecified part of unspecified bronchus or lung: Secondary | ICD-10-CM | POA: Diagnosis not present

## 2019-04-25 DIAGNOSIS — C801 Malignant (primary) neoplasm, unspecified: Secondary | ICD-10-CM | POA: Diagnosis not present

## 2019-04-25 DIAGNOSIS — J189 Pneumonia, unspecified organism: Secondary | ICD-10-CM | POA: Diagnosis not present

## 2019-04-25 DIAGNOSIS — G060 Intracranial abscess and granuloma: Secondary | ICD-10-CM | POA: Diagnosis not present

## 2019-04-25 DIAGNOSIS — A310 Pulmonary mycobacterial infection: Secondary | ICD-10-CM | POA: Diagnosis not present

## 2019-04-25 DIAGNOSIS — B9689 Other specified bacterial agents as the cause of diseases classified elsewhere: Secondary | ICD-10-CM | POA: Diagnosis not present

## 2019-04-25 DIAGNOSIS — Z01818 Encounter for other preprocedural examination: Secondary | ICD-10-CM | POA: Diagnosis not present

## 2019-04-25 DIAGNOSIS — C7931 Secondary malignant neoplasm of brain: Secondary | ICD-10-CM | POA: Diagnosis not present

## 2019-05-03 DIAGNOSIS — G9389 Other specified disorders of brain: Secondary | ICD-10-CM | POA: Diagnosis not present

## 2019-05-05 DIAGNOSIS — G9389 Other specified disorders of brain: Secondary | ICD-10-CM | POA: Diagnosis not present

## 2019-05-06 DIAGNOSIS — G9389 Other specified disorders of brain: Secondary | ICD-10-CM | POA: Diagnosis not present

## 2019-05-07 DIAGNOSIS — G9389 Other specified disorders of brain: Secondary | ICD-10-CM | POA: Diagnosis not present

## 2019-05-08 DIAGNOSIS — G9389 Other specified disorders of brain: Secondary | ICD-10-CM | POA: Diagnosis not present

## 2019-05-09 DIAGNOSIS — G9389 Other specified disorders of brain: Secondary | ICD-10-CM | POA: Diagnosis not present

## 2019-05-10 DIAGNOSIS — G9389 Other specified disorders of brain: Secondary | ICD-10-CM | POA: Diagnosis not present

## 2019-05-11 DIAGNOSIS — G9389 Other specified disorders of brain: Secondary | ICD-10-CM | POA: Diagnosis not present

## 2019-05-12 DIAGNOSIS — G9389 Other specified disorders of brain: Secondary | ICD-10-CM | POA: Diagnosis not present

## 2019-05-13 DIAGNOSIS — G9389 Other specified disorders of brain: Secondary | ICD-10-CM | POA: Diagnosis not present

## 2019-05-15 DIAGNOSIS — G9389 Other specified disorders of brain: Secondary | ICD-10-CM | POA: Diagnosis not present

## 2019-05-16 DIAGNOSIS — G9389 Other specified disorders of brain: Secondary | ICD-10-CM | POA: Diagnosis not present

## 2019-05-17 DIAGNOSIS — Z7189 Other specified counseling: Secondary | ICD-10-CM | POA: Diagnosis not present

## 2019-05-17 DIAGNOSIS — G060 Intracranial abscess and granuloma: Secondary | ICD-10-CM | POA: Diagnosis not present

## 2019-05-17 DIAGNOSIS — R05 Cough: Secondary | ICD-10-CM | POA: Diagnosis not present

## 2019-05-17 DIAGNOSIS — C3491 Malignant neoplasm of unspecified part of right bronchus or lung: Secondary | ICD-10-CM | POA: Diagnosis not present

## 2019-05-17 DIAGNOSIS — J189 Pneumonia, unspecified organism: Secondary | ICD-10-CM | POA: Diagnosis not present

## 2019-05-17 DIAGNOSIS — G9389 Other specified disorders of brain: Secondary | ICD-10-CM | POA: Diagnosis not present

## 2019-05-17 DIAGNOSIS — E278 Other specified disorders of adrenal gland: Secondary | ICD-10-CM | POA: Diagnosis not present

## 2019-05-17 DIAGNOSIS — J690 Pneumonitis due to inhalation of food and vomit: Secondary | ICD-10-CM | POA: Diagnosis not present

## 2019-05-18 DIAGNOSIS — G9389 Other specified disorders of brain: Secondary | ICD-10-CM | POA: Diagnosis not present

## 2019-05-19 DIAGNOSIS — G9389 Other specified disorders of brain: Secondary | ICD-10-CM | POA: Diagnosis not present

## 2019-05-20 DIAGNOSIS — G9389 Other specified disorders of brain: Secondary | ICD-10-CM | POA: Diagnosis not present

## 2019-05-21 DIAGNOSIS — Z794 Long term (current) use of insulin: Secondary | ICD-10-CM | POA: Diagnosis not present

## 2019-05-21 DIAGNOSIS — E1165 Type 2 diabetes mellitus with hyperglycemia: Secondary | ICD-10-CM | POA: Diagnosis not present

## 2019-05-21 DIAGNOSIS — E039 Hypothyroidism, unspecified: Secondary | ICD-10-CM | POA: Diagnosis not present

## 2019-05-22 DIAGNOSIS — E279 Disorder of adrenal gland, unspecified: Secondary | ICD-10-CM | POA: Diagnosis not present

## 2019-05-22 DIAGNOSIS — G9389 Other specified disorders of brain: Secondary | ICD-10-CM | POA: Diagnosis not present

## 2019-05-22 DIAGNOSIS — J189 Pneumonia, unspecified organism: Secondary | ICD-10-CM | POA: Diagnosis not present

## 2019-05-22 DIAGNOSIS — G060 Intracranial abscess and granuloma: Secondary | ICD-10-CM | POA: Diagnosis not present

## 2019-05-23 DIAGNOSIS — G9389 Other specified disorders of brain: Secondary | ICD-10-CM | POA: Diagnosis not present

## 2019-05-24 DIAGNOSIS — G9389 Other specified disorders of brain: Secondary | ICD-10-CM | POA: Diagnosis not present

## 2019-05-25 DIAGNOSIS — G9389 Other specified disorders of brain: Secondary | ICD-10-CM | POA: Diagnosis not present

## 2019-05-26 DIAGNOSIS — G9389 Other specified disorders of brain: Secondary | ICD-10-CM | POA: Diagnosis not present

## 2019-05-27 DIAGNOSIS — C3491 Malignant neoplasm of unspecified part of right bronchus or lung: Secondary | ICD-10-CM | POA: Diagnosis not present

## 2019-05-27 DIAGNOSIS — G9389 Other specified disorders of brain: Secondary | ICD-10-CM | POA: Diagnosis not present

## 2019-05-28 DIAGNOSIS — G9389 Other specified disorders of brain: Secondary | ICD-10-CM | POA: Diagnosis not present

## 2019-05-29 DIAGNOSIS — G9389 Other specified disorders of brain: Secondary | ICD-10-CM | POA: Diagnosis not present

## 2019-05-30 DIAGNOSIS — G9389 Other specified disorders of brain: Secondary | ICD-10-CM | POA: Diagnosis not present

## 2019-05-31 DIAGNOSIS — G9389 Other specified disorders of brain: Secondary | ICD-10-CM | POA: Diagnosis not present

## 2019-06-01 DIAGNOSIS — G9389 Other specified disorders of brain: Secondary | ICD-10-CM | POA: Diagnosis not present

## 2019-06-02 DIAGNOSIS — G9389 Other specified disorders of brain: Secondary | ICD-10-CM | POA: Diagnosis not present

## 2019-06-03 DIAGNOSIS — G9389 Other specified disorders of brain: Secondary | ICD-10-CM | POA: Diagnosis not present

## 2019-06-04 DIAGNOSIS — G9389 Other specified disorders of brain: Secondary | ICD-10-CM | POA: Diagnosis not present

## 2019-06-05 DIAGNOSIS — G9389 Other specified disorders of brain: Secondary | ICD-10-CM | POA: Diagnosis not present

## 2019-06-06 DIAGNOSIS — G9389 Other specified disorders of brain: Secondary | ICD-10-CM | POA: Diagnosis not present

## 2019-06-07 DIAGNOSIS — G060 Intracranial abscess and granuloma: Secondary | ICD-10-CM | POA: Diagnosis not present

## 2019-06-07 DIAGNOSIS — G9389 Other specified disorders of brain: Secondary | ICD-10-CM | POA: Diagnosis not present

## 2019-06-07 DIAGNOSIS — Z452 Encounter for adjustment and management of vascular access device: Secondary | ICD-10-CM | POA: Diagnosis not present

## 2019-06-07 DIAGNOSIS — C3491 Malignant neoplasm of unspecified part of right bronchus or lung: Secondary | ICD-10-CM | POA: Diagnosis not present

## 2019-06-07 DIAGNOSIS — Z792 Long term (current) use of antibiotics: Secondary | ICD-10-CM | POA: Diagnosis not present

## 2019-06-08 DIAGNOSIS — G9389 Other specified disorders of brain: Secondary | ICD-10-CM | POA: Diagnosis not present

## 2019-06-09 DIAGNOSIS — G9389 Other specified disorders of brain: Secondary | ICD-10-CM | POA: Diagnosis not present

## 2019-06-10 DIAGNOSIS — G9389 Other specified disorders of brain: Secondary | ICD-10-CM | POA: Diagnosis not present

## 2019-06-11 DIAGNOSIS — G9389 Other specified disorders of brain: Secondary | ICD-10-CM | POA: Diagnosis not present

## 2019-06-12 DIAGNOSIS — G9389 Other specified disorders of brain: Secondary | ICD-10-CM | POA: Diagnosis not present

## 2019-06-14 DIAGNOSIS — G9389 Other specified disorders of brain: Secondary | ICD-10-CM | POA: Diagnosis not present

## 2019-06-14 DIAGNOSIS — G060 Intracranial abscess and granuloma: Secondary | ICD-10-CM | POA: Diagnosis not present

## 2019-06-18 DIAGNOSIS — G9389 Other specified disorders of brain: Secondary | ICD-10-CM | POA: Diagnosis not present

## 2019-06-19 DIAGNOSIS — G9389 Other specified disorders of brain: Secondary | ICD-10-CM | POA: Diagnosis not present

## 2019-06-20 DIAGNOSIS — G9389 Other specified disorders of brain: Secondary | ICD-10-CM | POA: Diagnosis not present

## 2019-06-21 DIAGNOSIS — G9389 Other specified disorders of brain: Secondary | ICD-10-CM | POA: Diagnosis not present

## 2019-06-22 DIAGNOSIS — G9389 Other specified disorders of brain: Secondary | ICD-10-CM | POA: Diagnosis not present

## 2019-06-23 DIAGNOSIS — G9389 Other specified disorders of brain: Secondary | ICD-10-CM | POA: Diagnosis not present

## 2019-06-24 DIAGNOSIS — G9389 Other specified disorders of brain: Secondary | ICD-10-CM | POA: Diagnosis not present

## 2019-06-25 DIAGNOSIS — G9389 Other specified disorders of brain: Secondary | ICD-10-CM | POA: Diagnosis not present

## 2019-06-26 DIAGNOSIS — G9389 Other specified disorders of brain: Secondary | ICD-10-CM | POA: Diagnosis not present

## 2019-06-27 DIAGNOSIS — G9389 Other specified disorders of brain: Secondary | ICD-10-CM | POA: Diagnosis not present

## 2019-06-28 DIAGNOSIS — G9389 Other specified disorders of brain: Secondary | ICD-10-CM | POA: Diagnosis not present

## 2019-06-29 DIAGNOSIS — G9389 Other specified disorders of brain: Secondary | ICD-10-CM | POA: Diagnosis not present

## 2019-06-30 DIAGNOSIS — G9389 Other specified disorders of brain: Secondary | ICD-10-CM | POA: Diagnosis not present

## 2019-07-01 DIAGNOSIS — G9389 Other specified disorders of brain: Secondary | ICD-10-CM | POA: Diagnosis not present

## 2019-07-02 DIAGNOSIS — G9389 Other specified disorders of brain: Secondary | ICD-10-CM | POA: Diagnosis not present

## 2019-07-03 DIAGNOSIS — G9389 Other specified disorders of brain: Secondary | ICD-10-CM | POA: Diagnosis not present

## 2019-07-04 DIAGNOSIS — G9389 Other specified disorders of brain: Secondary | ICD-10-CM | POA: Diagnosis not present

## 2019-07-05 DIAGNOSIS — G9389 Other specified disorders of brain: Secondary | ICD-10-CM | POA: Diagnosis not present

## 2019-07-06 DIAGNOSIS — G9389 Other specified disorders of brain: Secondary | ICD-10-CM | POA: Diagnosis not present

## 2019-07-07 DIAGNOSIS — G9389 Other specified disorders of brain: Secondary | ICD-10-CM | POA: Diagnosis not present

## 2019-07-08 DIAGNOSIS — G9389 Other specified disorders of brain: Secondary | ICD-10-CM | POA: Diagnosis not present

## 2019-07-09 DIAGNOSIS — S0101XA Laceration without foreign body of scalp, initial encounter: Secondary | ICD-10-CM | POA: Diagnosis not present

## 2019-07-09 DIAGNOSIS — Z9221 Personal history of antineoplastic chemotherapy: Secondary | ICD-10-CM | POA: Diagnosis not present

## 2019-07-09 DIAGNOSIS — C3491 Malignant neoplasm of unspecified part of right bronchus or lung: Secondary | ICD-10-CM | POA: Diagnosis not present

## 2019-07-09 DIAGNOSIS — G9389 Other specified disorders of brain: Secondary | ICD-10-CM | POA: Diagnosis not present

## 2019-07-09 DIAGNOSIS — Z79899 Other long term (current) drug therapy: Secondary | ICD-10-CM | POA: Diagnosis not present

## 2019-07-09 DIAGNOSIS — E279 Disorder of adrenal gland, unspecified: Secondary | ICD-10-CM | POA: Diagnosis not present

## 2019-07-09 DIAGNOSIS — W19XXXA Unspecified fall, initial encounter: Secondary | ICD-10-CM | POA: Diagnosis not present

## 2019-07-09 DIAGNOSIS — W1830XA Fall on same level, unspecified, initial encounter: Secondary | ICD-10-CM | POA: Diagnosis not present

## 2019-07-09 DIAGNOSIS — Z902 Acquired absence of lung [part of]: Secondary | ICD-10-CM | POA: Diagnosis not present

## 2019-07-09 DIAGNOSIS — S199XXA Unspecified injury of neck, initial encounter: Secondary | ICD-10-CM | POA: Diagnosis not present

## 2019-07-09 DIAGNOSIS — S0990XA Unspecified injury of head, initial encounter: Secondary | ICD-10-CM | POA: Diagnosis not present

## 2019-07-09 DIAGNOSIS — S098XXA Other specified injuries of head, initial encounter: Secondary | ICD-10-CM | POA: Diagnosis not present

## 2019-07-09 DIAGNOSIS — C3431 Malignant neoplasm of lower lobe, right bronchus or lung: Secondary | ICD-10-CM | POA: Diagnosis not present

## 2019-07-09 DIAGNOSIS — G060 Intracranial abscess and granuloma: Secondary | ICD-10-CM | POA: Diagnosis not present

## 2019-07-09 DIAGNOSIS — S0181XA Laceration without foreign body of other part of head, initial encounter: Secondary | ICD-10-CM | POA: Diagnosis not present

## 2019-07-10 DIAGNOSIS — G9389 Other specified disorders of brain: Secondary | ICD-10-CM | POA: Diagnosis not present

## 2019-07-11 DIAGNOSIS — G9389 Other specified disorders of brain: Secondary | ICD-10-CM | POA: Diagnosis not present

## 2019-07-12 DIAGNOSIS — G9389 Other specified disorders of brain: Secondary | ICD-10-CM | POA: Diagnosis not present

## 2019-07-13 DIAGNOSIS — G9389 Other specified disorders of brain: Secondary | ICD-10-CM | POA: Diagnosis not present

## 2019-07-14 DIAGNOSIS — G9389 Other specified disorders of brain: Secondary | ICD-10-CM | POA: Diagnosis not present

## 2019-07-15 DIAGNOSIS — C3491 Malignant neoplasm of unspecified part of right bronchus or lung: Secondary | ICD-10-CM | POA: Diagnosis not present

## 2019-07-15 DIAGNOSIS — Z4802 Encounter for removal of sutures: Secondary | ICD-10-CM | POA: Diagnosis not present

## 2019-07-15 DIAGNOSIS — G9389 Other specified disorders of brain: Secondary | ICD-10-CM | POA: Diagnosis not present

## 2019-07-15 DIAGNOSIS — Z5189 Encounter for other specified aftercare: Secondary | ICD-10-CM | POA: Diagnosis not present

## 2019-07-16 DIAGNOSIS — G9389 Other specified disorders of brain: Secondary | ICD-10-CM | POA: Diagnosis not present

## 2019-07-17 DIAGNOSIS — C3431 Malignant neoplasm of lower lobe, right bronchus or lung: Secondary | ICD-10-CM | POA: Diagnosis not present

## 2019-07-17 DIAGNOSIS — C3491 Malignant neoplasm of unspecified part of right bronchus or lung: Secondary | ICD-10-CM | POA: Diagnosis not present

## 2019-07-18 DIAGNOSIS — G9389 Other specified disorders of brain: Secondary | ICD-10-CM | POA: Diagnosis not present

## 2019-07-19 DIAGNOSIS — Z452 Encounter for adjustment and management of vascular access device: Secondary | ICD-10-CM | POA: Diagnosis not present

## 2019-07-19 DIAGNOSIS — G060 Intracranial abscess and granuloma: Secondary | ICD-10-CM | POA: Diagnosis not present

## 2019-07-19 DIAGNOSIS — C3411 Malignant neoplasm of upper lobe, right bronchus or lung: Secondary | ICD-10-CM | POA: Diagnosis not present

## 2019-07-19 DIAGNOSIS — Z792 Long term (current) use of antibiotics: Secondary | ICD-10-CM | POA: Diagnosis not present

## 2019-07-19 DIAGNOSIS — G9389 Other specified disorders of brain: Secondary | ICD-10-CM | POA: Diagnosis not present

## 2019-07-19 DIAGNOSIS — Z4802 Encounter for removal of sutures: Secondary | ICD-10-CM | POA: Diagnosis not present

## 2019-07-20 DIAGNOSIS — G9389 Other specified disorders of brain: Secondary | ICD-10-CM | POA: Diagnosis not present

## 2019-07-21 DIAGNOSIS — G9389 Other specified disorders of brain: Secondary | ICD-10-CM | POA: Diagnosis not present

## 2019-07-22 DIAGNOSIS — G9389 Other specified disorders of brain: Secondary | ICD-10-CM | POA: Diagnosis not present

## 2019-07-23 DIAGNOSIS — G9389 Other specified disorders of brain: Secondary | ICD-10-CM | POA: Diagnosis not present

## 2019-07-24 DIAGNOSIS — G9389 Other specified disorders of brain: Secondary | ICD-10-CM | POA: Diagnosis not present

## 2019-07-25 DIAGNOSIS — Z23 Encounter for immunization: Secondary | ICD-10-CM | POA: Diagnosis not present

## 2019-07-26 DIAGNOSIS — G9389 Other specified disorders of brain: Secondary | ICD-10-CM | POA: Diagnosis not present

## 2019-07-27 DIAGNOSIS — G9389 Other specified disorders of brain: Secondary | ICD-10-CM | POA: Diagnosis not present

## 2019-07-28 DIAGNOSIS — G9389 Other specified disorders of brain: Secondary | ICD-10-CM | POA: Diagnosis not present

## 2019-07-29 DIAGNOSIS — G9389 Other specified disorders of brain: Secondary | ICD-10-CM | POA: Diagnosis not present

## 2019-07-30 DIAGNOSIS — G9389 Other specified disorders of brain: Secondary | ICD-10-CM | POA: Diagnosis not present

## 2019-07-31 DIAGNOSIS — G9389 Other specified disorders of brain: Secondary | ICD-10-CM | POA: Diagnosis not present

## 2019-08-01 DIAGNOSIS — G9389 Other specified disorders of brain: Secondary | ICD-10-CM | POA: Diagnosis not present

## 2019-08-02 DIAGNOSIS — G9389 Other specified disorders of brain: Secondary | ICD-10-CM | POA: Diagnosis not present

## 2019-08-03 DIAGNOSIS — G9389 Other specified disorders of brain: Secondary | ICD-10-CM | POA: Diagnosis not present

## 2019-08-04 DIAGNOSIS — G9389 Other specified disorders of brain: Secondary | ICD-10-CM | POA: Diagnosis not present

## 2019-08-05 DIAGNOSIS — G9389 Other specified disorders of brain: Secondary | ICD-10-CM | POA: Diagnosis not present

## 2019-08-06 DIAGNOSIS — G9389 Other specified disorders of brain: Secondary | ICD-10-CM | POA: Diagnosis not present

## 2019-08-07 DIAGNOSIS — G9389 Other specified disorders of brain: Secondary | ICD-10-CM | POA: Diagnosis not present

## 2019-08-08 DIAGNOSIS — G9389 Other specified disorders of brain: Secondary | ICD-10-CM | POA: Diagnosis not present

## 2019-08-09 DIAGNOSIS — G9389 Other specified disorders of brain: Secondary | ICD-10-CM | POA: Diagnosis not present

## 2019-08-10 DIAGNOSIS — G9389 Other specified disorders of brain: Secondary | ICD-10-CM | POA: Diagnosis not present

## 2019-08-11 DIAGNOSIS — G9389 Other specified disorders of brain: Secondary | ICD-10-CM | POA: Diagnosis not present

## 2019-08-12 DIAGNOSIS — G9389 Other specified disorders of brain: Secondary | ICD-10-CM | POA: Diagnosis not present

## 2019-08-13 DIAGNOSIS — G9389 Other specified disorders of brain: Secondary | ICD-10-CM | POA: Diagnosis not present

## 2019-08-14 DIAGNOSIS — G9389 Other specified disorders of brain: Secondary | ICD-10-CM | POA: Diagnosis not present

## 2019-08-15 DIAGNOSIS — G9389 Other specified disorders of brain: Secondary | ICD-10-CM | POA: Diagnosis not present

## 2019-08-16 DIAGNOSIS — Z452 Encounter for adjustment and management of vascular access device: Secondary | ICD-10-CM | POA: Diagnosis not present

## 2019-08-16 DIAGNOSIS — G060 Intracranial abscess and granuloma: Secondary | ICD-10-CM | POA: Diagnosis not present

## 2019-08-16 DIAGNOSIS — C3491 Malignant neoplasm of unspecified part of right bronchus or lung: Secondary | ICD-10-CM | POA: Diagnosis not present

## 2019-08-16 DIAGNOSIS — R05 Cough: Secondary | ICD-10-CM | POA: Diagnosis not present

## 2019-08-16 DIAGNOSIS — G9389 Other specified disorders of brain: Secondary | ICD-10-CM | POA: Diagnosis not present

## 2019-08-16 DIAGNOSIS — C3411 Malignant neoplasm of upper lobe, right bronchus or lung: Secondary | ICD-10-CM | POA: Diagnosis not present

## 2019-08-16 DIAGNOSIS — Z792 Long term (current) use of antibiotics: Secondary | ICD-10-CM | POA: Diagnosis not present

## 2019-08-17 DIAGNOSIS — G9389 Other specified disorders of brain: Secondary | ICD-10-CM | POA: Diagnosis not present

## 2019-08-29 DIAGNOSIS — E063 Autoimmune thyroiditis: Secondary | ICD-10-CM | POA: Diagnosis not present

## 2019-08-29 DIAGNOSIS — E118 Type 2 diabetes mellitus with unspecified complications: Secondary | ICD-10-CM | POA: Diagnosis not present

## 2019-09-10 DIAGNOSIS — E118 Type 2 diabetes mellitus with unspecified complications: Secondary | ICD-10-CM | POA: Diagnosis not present

## 2019-09-10 DIAGNOSIS — R590 Localized enlarged lymph nodes: Secondary | ICD-10-CM | POA: Diagnosis not present

## 2019-09-10 DIAGNOSIS — C7972 Secondary malignant neoplasm of left adrenal gland: Secondary | ICD-10-CM | POA: Diagnosis not present

## 2019-09-10 DIAGNOSIS — Z7984 Long term (current) use of oral hypoglycemic drugs: Secondary | ICD-10-CM | POA: Diagnosis not present

## 2019-09-10 DIAGNOSIS — Z6823 Body mass index (BMI) 23.0-23.9, adult: Secondary | ICD-10-CM | POA: Diagnosis not present

## 2019-09-10 DIAGNOSIS — C7971 Secondary malignant neoplasm of right adrenal gland: Secondary | ICD-10-CM | POA: Diagnosis not present

## 2019-09-10 DIAGNOSIS — C3491 Malignant neoplasm of unspecified part of right bronchus or lung: Secondary | ICD-10-CM | POA: Diagnosis not present

## 2019-09-10 DIAGNOSIS — E2740 Unspecified adrenocortical insufficiency: Secondary | ICD-10-CM | POA: Diagnosis not present

## 2019-09-10 DIAGNOSIS — R64 Cachexia: Secondary | ICD-10-CM | POA: Diagnosis not present

## 2019-09-10 DIAGNOSIS — J9819 Other pulmonary collapse: Secondary | ICD-10-CM | POA: Diagnosis not present

## 2019-09-10 DIAGNOSIS — R932 Abnormal findings on diagnostic imaging of liver and biliary tract: Secondary | ICD-10-CM | POA: Diagnosis not present

## 2019-09-10 DIAGNOSIS — C3431 Malignant neoplasm of lower lobe, right bronchus or lung: Secondary | ICD-10-CM | POA: Diagnosis not present

## 2019-09-25 DIAGNOSIS — C3411 Malignant neoplasm of upper lobe, right bronchus or lung: Secondary | ICD-10-CM | POA: Diagnosis not present

## 2019-09-25 DIAGNOSIS — C3491 Malignant neoplasm of unspecified part of right bronchus or lung: Secondary | ICD-10-CM | POA: Diagnosis not present

## 2019-10-09 DIAGNOSIS — C3491 Malignant neoplasm of unspecified part of right bronchus or lung: Secondary | ICD-10-CM | POA: Diagnosis not present

## 2019-10-09 DIAGNOSIS — C3411 Malignant neoplasm of upper lobe, right bronchus or lung: Secondary | ICD-10-CM | POA: Diagnosis not present

## 2019-12-11 DEATH — deceased
